# Patient Record
Sex: Female | Born: 1980
Health system: Southern US, Community
[De-identification: ages and names within clinical notes are randomized; demographics above are authoritative.]

## PROBLEM LIST (undated history)

## (undated) ENCOUNTER — Inpatient Hospital Stay (HOSPITAL_COMMUNITY): Payer: Self-pay

## (undated) DIAGNOSIS — E0590 Thyrotoxicosis, unspecified without thyrotoxic crisis or storm: Secondary | ICD-10-CM

## (undated) DIAGNOSIS — R1110 Vomiting, unspecified: Secondary | ICD-10-CM

## (undated) DIAGNOSIS — B789 Strongyloidiasis, unspecified: Secondary | ICD-10-CM

## (undated) DIAGNOSIS — D219 Benign neoplasm of connective and other soft tissue, unspecified: Secondary | ICD-10-CM

## (undated) HISTORY — DX: Strongyloidiasis, unspecified: B78.9

## (undated) HISTORY — PX: OTHER SURGICAL HISTORY: SHX169

## (undated) HISTORY — PX: NO PAST SURGERIES: SHX2092

---

## 2008-11-29 ENCOUNTER — Other Ambulatory Visit: Admission: RE | Admit: 2008-11-29 | Discharge: 2008-11-29 | Payer: Self-pay | Admitting: Family Medicine

## 2008-12-18 ENCOUNTER — Emergency Department (HOSPITAL_COMMUNITY): Admission: EM | Admit: 2008-12-18 | Discharge: 2008-12-18 | Payer: Self-pay | Admitting: Family Medicine

## 2008-12-31 ENCOUNTER — Ambulatory Visit (HOSPITAL_COMMUNITY): Admission: RE | Admit: 2008-12-31 | Discharge: 2008-12-31 | Payer: Self-pay | Admitting: Gastroenterology

## 2009-01-15 ENCOUNTER — Ambulatory Visit (HOSPITAL_COMMUNITY): Admission: RE | Admit: 2009-01-15 | Discharge: 2009-01-15 | Payer: Self-pay | Admitting: Gastroenterology

## 2009-01-23 ENCOUNTER — Other Ambulatory Visit: Admission: RE | Admit: 2009-01-23 | Discharge: 2009-01-23 | Payer: Self-pay | Admitting: Obstetrics and Gynecology

## 2009-02-19 ENCOUNTER — Encounter (INDEPENDENT_AMBULATORY_CARE_PROVIDER_SITE_OTHER): Payer: Self-pay | Admitting: Obstetrics and Gynecology

## 2009-02-19 ENCOUNTER — Ambulatory Visit (HOSPITAL_COMMUNITY): Admission: RE | Admit: 2009-02-19 | Discharge: 2009-02-19 | Payer: Self-pay | Admitting: Obstetrics and Gynecology

## 2010-04-08 ENCOUNTER — Inpatient Hospital Stay (HOSPITAL_COMMUNITY): Admission: AD | Admit: 2010-04-08 | Discharge: 2010-04-10 | Payer: Self-pay | Admitting: Obstetrics and Gynecology

## 2010-08-12 LAB — CBC
HCT: 37.9 % (ref 36.0–46.0)
Hemoglobin: 12.6 g/dL (ref 12.0–15.0)
MCH: 33.4 pg (ref 26.0–34.0)
MCHC: 33.3 g/dL (ref 30.0–36.0)
MCV: 101.1 fL — ABNORMAL HIGH (ref 78.0–100.0)
Platelets: 167 10*3/uL (ref 150–400)
RDW: 13.8 % (ref 11.5–15.5)
WBC: 16.5 10*3/uL — ABNORMAL HIGH (ref 4.0–10.5)

## 2010-08-12 LAB — RH IMMUNE GLOB WKUP(>/=20WKS)(NOT WOMEN'S HOSP): Unit division: 0

## 2010-09-05 LAB — CBC
Hemoglobin: 13.4 g/dL (ref 12.0–15.0)
RDW: 19.8 % — ABNORMAL HIGH (ref 11.5–15.5)

## 2010-09-05 LAB — URINALYSIS, ROUTINE W REFLEX MICROSCOPIC
Bilirubin Urine: NEGATIVE
Glucose, UA: NEGATIVE mg/dL
Hgb urine dipstick: NEGATIVE
Ketones, ur: NEGATIVE mg/dL
Nitrite: NEGATIVE
pH: 8 (ref 5.0–8.0)

## 2010-09-05 LAB — RH IMMUNE GLOBULIN WORKUP (NOT WOMEN'S HOSP)
ABO/RH(D): O NEG
Antibody Screen: NEGATIVE

## 2010-09-23 ENCOUNTER — Other Ambulatory Visit (HOSPITAL_COMMUNITY)
Admission: RE | Admit: 2010-09-23 | Discharge: 2010-09-23 | Disposition: A | Discharge status: Home / Self Care | Payer: Self-pay | Source: Ambulatory Visit | Attending: Obstetrics and Gynecology | Admitting: Obstetrics and Gynecology

## 2010-09-23 DIAGNOSIS — Z01419 Encounter for gynecological examination (general) (routine) without abnormal findings: Secondary | ICD-10-CM | POA: Insufficient documentation

## 2011-06-02 NOTE — L&D Delivery Note (Signed)
Delivery Note At 5:21 PM a viable female was delivered via Vaginal, Spontaneous Delivery (Presentation: ; Occiput Anterior).  APGAR:9 ,9 ; weight .   Placenta status intact : , .  Cord: 3 vessels with the following complications: None.  Cord pH: NA  Anesthesia: Epidural  Episiotomy: Right Mediolateral Lacerations: None Suture Repair: 3.0 vicryl Est. Blood Loss (mL): 400  Mom to postpartum.  Baby to nursery-stable.  Benigno Check J. 04/11/2012, 5:50 PM

## 2011-09-04 ENCOUNTER — Encounter (HOSPITAL_COMMUNITY): Payer: Self-pay | Admitting: *Deleted

## 2011-09-04 ENCOUNTER — Inpatient Hospital Stay (HOSPITAL_COMMUNITY)
Admission: AD | Admit: 2011-09-04 | Discharge: 2011-09-04 | Disposition: A | Discharge status: Home / Self Care | Payer: 59 | Source: Ambulatory Visit | Attending: Obstetrics and Gynecology | Admitting: Obstetrics and Gynecology

## 2011-09-04 DIAGNOSIS — O210 Mild hyperemesis gravidarum: Secondary | ICD-10-CM

## 2011-09-04 LAB — URINALYSIS, ROUTINE W REFLEX MICROSCOPIC
Glucose, UA: NEGATIVE mg/dL
Nitrite: NEGATIVE
Specific Gravity, Urine: 1.01 (ref 1.005–1.030)
pH: 8.5 — ABNORMAL HIGH (ref 5.0–8.0)

## 2011-09-04 LAB — URINE MICROSCOPIC-ADD ON

## 2011-09-04 MED ORDER — ONDANSETRON 8 MG PO TBDP: 8.0000 mg | ORAL_TABLET | Freq: Three times a day (TID) | ORAL | Status: AC | PRN

## 2011-09-04 MED ORDER — DEXTROSE 5 % IV BOLUS: 1000.0000 mL | Freq: Once | INTRAVENOUS | Status: DC

## 2011-09-04 MED ORDER — ONDANSETRON 8 MG/NS 50 ML IVPB
8.0000 mg | Freq: Once | INTRAVENOUS | Status: AC
  Administered 2011-09-04: 8 mg via INTRAVENOUS
  Filled 2011-09-04: qty 8

## 2011-09-04 MED ORDER — DEXTROSE IN LACTATED RINGERS 5 % IV SOLN
Freq: Once | INTRAVENOUS | Status: AC
  Administered 2011-09-04: 14:00:00 via INTRAVENOUS

## 2011-09-04 NOTE — Discharge Instructions (Signed)
Morning Sickness Morning sickness is when you feel sick to your stomach (nauseous) during pregnancy. You may feel sick to your stomach and throw up (vomit). You may feel sick in the morning, but you can feel this way any time of day. Some women feel very sick to their stomach and cannot stop throwing up (hyperemesis gravidarum). HOME CARE  Take multivitamins as told by your doctor. Taking multivitamins before getting pregnant can stop or lessen the harshness of morning sickness.   Eat dry toast or unsalted crackers before getting out of bed.   Eat 5 to 6 small meals a day.   Eat dry and bland foods like rice and baked potatoes.   Do not drink liquids with meals. Drink between meals.   Do not eat greasy, fatty, or spicy foods.   Have someone cook for you if the smell of food causes you to feel sick or throw up.   Do not take vitamins with iron, or as told by your doctor.   Eat protein when you need a snack (nuts, yogurt, cheese).   Eat unsweetened gelatins for dessert.   Wear a bracelet used for sea sickness (acupressure wristband).   Go to a doctor that puts thin needles into certain body points (acupuncture) to improve how you feel.   Do not smoke.   Use a humidifier to keep the air in your house free of odors.  GET HELP RIGHT AWAY IF:   You feel very sick to your stomach and cannot stop throwing up.   You pass out (faint).   You have a fever.   You need medicine to feel better.   You feel dizzy or lightheaded.   You are losing weight.   You need help knowing what to eat and what not to eat.  MAKE SURE YOU:   Understand these instructions.   Will watch your condition.   Will get help right away if you are not doing well or get worse.  Document Released: 06/25/2004 Document Revised: 05/07/2011 Document Reviewed: 08/15/2009 [employer] Patient Information 2012 [employer].Morning Sickness Morning sickness is when you feel sick to your stomach (nauseous) during  pregnancy. You may feel sick to your stomach and throw up (vomit). You may feel sick in the morning, but you can feel this way any time of day. Some women feel very sick to their stomach and cannot stop throwing up (hyperemesis gravidarum). HOME CARE  Take multivitamins as told by your doctor. Taking multivitamins before getting pregnant can stop or lessen the harshness of morning sickness.   Eat dry toast or unsalted crackers before getting out of bed.   Eat 5 to 6 small meals a day.   Eat dry and bland foods like rice and baked potatoes.   Do not drink liquids with meals. Drink between meals.   Do not eat greasy, fatty, or spicy foods.   Have someone cook for you if the smell of food causes you to feel sick or throw up.   Do not take vitamins with iron, or as told by your doctor.   Eat protein when you need a snack (nuts, yogurt, cheese).   Eat unsweetened gelatins for dessert.   Wear a bracelet used for sea sickness (acupressure wristband).   Go to a doctor that puts thin needles into certain body points (acupuncture) to improve how you feel.   Do not smoke.   Use a humidifier to keep the air in your house free of odors.  GET   HELP RIGHT AWAY IF:   You feel very sick to your stomach and cannot stop throwing up.   You pass out (faint).   You have a fever.   You need medicine to feel better.   You feel dizzy or lightheaded.   You are losing weight.   You need help knowing what to eat and what not to eat.  MAKE SURE YOU:   Understand these instructions.   Will watch your condition.   Will get help right away if you are not doing well or get worse.  Document Released: 06/25/2004 Document Revised: 05/07/2011 Document Reviewed: 08/15/2009 [employer] Patient Information 2012 [employer]. 

## 2011-09-04 NOTE — MAU Provider Note (Signed)
  History     CSN: [id number]  Arrival date and time: 09/04/11 1153   None     No chief complaint on file.  HPI Pt is [redacted] weeks pregnant and has been vomiting 6 x daily.  She has been nauseated for 3 weeks.  She denies spotting/bleeding/abdominal pain, constipation or diarrhea. Pt went to Dr. Dawayne Patricia office this morning and they sent her here for hydration and antiemetics. No past medical history on file.  No past surgical history on file.  No family history on file.  History  Substance Use Topics  . Smoking status: Not on file  . Smokeless tobacco: Not on file  . Alcohol Use: Not on file    Allergies: Allergies not on file  No prescriptions prior to admission    Review of Systems  Constitutional: Negative for fever.  Gastrointestinal: Positive for nausea and vomiting. Negative for abdominal pain, diarrhea and constipation.  Genitourinary: Negative for dysuria and urgency.   Physical Exam   There were no vitals taken for this visit.  Physical Exam  Vitals reviewed. Constitutional: She is oriented to person, place, and time. She appears well-developed and well-nourished.  HENT:  Head: Normocephalic.  Eyes: Pupils are equal, round, and reactive to light.  Neck: Normal range of motion. Neck supple.  Cardiovascular: Normal rate.   Respiratory: Effort normal.  GI: Soft.  Musculoskeletal: Normal range of motion.  Neurological: She is alert and oriented to person, place, and time.  Skin: Skin is warm and dry.  Psychiatric: She has a normal mood and affect.    MAU Course  Procedures Results for orders placed during the hospital encounter of 09/04/11 (from the past 24 hour(s))  URINALYSIS, ROUTINE W REFLEX MICROSCOPIC     Status: Abnormal   Collection Time   09/04/11 12:44 PM      Component Value Range   Color, Urine YELLOW  YELLOW    APPearance CLEAR  CLEAR    Specific Gravity, Urine 1.010  1.005 - 1.030    pH 8.5 (*) 5.0 - 8.0    Glucose, UA NEGATIVE  NEGATIVE  (mg/dL)   Hgb urine dipstick NEGATIVE  NEGATIVE    Bilirubin Urine NEGATIVE  NEGATIVE    Ketones, ur NEGATIVE  NEGATIVE (mg/dL)   Protein, ur NEGATIVE  NEGATIVE (mg/dL)   Urobilinogen, UA 0.2  0.0 - 1.0 (mg/dL)   Nitrite NEGATIVE  NEGATIVE    Leukocytes, UA MODERATE (*) NEGATIVE   URINE MICROSCOPIC-ADD ON     Status: Normal   Collection Time   09/04/11 12:44 PM      Component Value Range   Squamous Epithelial / LPF RARE  RARE    WBC, UA 0-2  <3 (WBC/hpf)   RBC / HPF 0-2  <3 (RBC/hpf)   Bacteria, UA RARE  RARE   pt hydrated with D5LR and antiemetic of Zofran IVP And tolerated PO crackers and water Urine sent for C&S since pt had mod leukocytes    Assessment and Plan  Hyperemesis in pregnancy Rx for Zofran and f/u for prenatal care  Daruis Swaim 09/04/2011, 12:37 PM

## 2011-09-04 NOTE — MAU Note (Signed)
Pt reports having N/V x3 weeks. Not able to keep anything down. Having ptyalism as well.

## 2011-09-10 ENCOUNTER — Other Ambulatory Visit (HOSPITAL_COMMUNITY)
Admission: RE | Admit: 2011-09-10 | Discharge: 2011-09-10 | Disposition: A | Discharge status: Home / Self Care | Payer: 59 | Source: Ambulatory Visit | Attending: Obstetrics and Gynecology | Admitting: Obstetrics and Gynecology

## 2011-09-10 ENCOUNTER — Other Ambulatory Visit: Payer: Self-pay | Admitting: Obstetrics and Gynecology

## 2011-09-10 ENCOUNTER — Other Ambulatory Visit (HOSPITAL_COMMUNITY): Admission: RE | Admit: 2011-09-10 | Payer: 59 | Source: Ambulatory Visit | Admitting: Obstetrics and Gynecology

## 2011-09-10 DIAGNOSIS — Z01419 Encounter for gynecological examination (general) (routine) without abnormal findings: Secondary | ICD-10-CM | POA: Insufficient documentation

## 2011-09-10 DIAGNOSIS — Z113 Encounter for screening for infections with a predominantly sexual mode of transmission: Secondary | ICD-10-CM | POA: Insufficient documentation

## 2011-09-13 NOTE — MAU Provider Note (Signed)
Pt in office for routine ob intake interview with nurse, when nausea/vomiting discussed.  Pt's husband reported 5+ episodes of emesis daily.   He reported pt complains of dizziness and fatigue.  Pt also with excessive saliva. Pt had not used any medications for n/v. Pt sent to MAU for IVF and IV Zofran. Gen:  NAD CV:  RRR, no murmers Lungs:  CTA bilaterally, no crackles Abd: soft, nontender Ext: No edema, no tenderness  UA 3+ Ket at office  A/p Hyperemesis, first trimester IVF and IV Zofran. D/c home with Zofran. Precautions previously given at office.

## 2011-09-22 ENCOUNTER — Inpatient Hospital Stay (HOSPITAL_COMMUNITY)
Admission: AD | Admit: 2011-09-22 | Discharge: 2011-09-23 | Disposition: A | Discharge status: Home / Self Care | Payer: 59 | Source: Ambulatory Visit | Attending: Obstetrics and Gynecology | Admitting: Obstetrics and Gynecology

## 2011-09-22 DIAGNOSIS — O210 Mild hyperemesis gravidarum: Secondary | ICD-10-CM | POA: Insufficient documentation

## 2011-09-22 DIAGNOSIS — O219 Vomiting of pregnancy, unspecified: Secondary | ICD-10-CM

## 2011-09-22 HISTORY — DX: Vomiting, unspecified: R11.10

## 2011-09-23 ENCOUNTER — Encounter (HOSPITAL_COMMUNITY): Payer: Self-pay | Admitting: *Deleted

## 2011-09-23 LAB — COMPREHENSIVE METABOLIC PANEL
ALT: 9 U/L (ref 0–35)
CO2: 27 mEq/L (ref 19–32)
Calcium: 9.9 mg/dL (ref 8.4–10.5)
Creatinine, Ser: 0.49 mg/dL — ABNORMAL LOW (ref 0.50–1.10)
GFR calc Af Amer: >90 mL/min (ref >90)
GFR calc non Af Amer: >90 mL/min (ref >90)
Glucose, Bld: 92 mg/dL (ref 70–99)
Sodium: 132 mEq/L — ABNORMAL LOW (ref 135–145)
Total Protein: 6.7 g/dL (ref 6.0–8.3)

## 2011-09-23 LAB — CBC
MCH: 31.5 pg (ref 26.0–34.0)
MCHC: 34.8 g/dL (ref 30.0–36.0)
MCV: 90.6 fL (ref 78.0–100.0)
Platelets: 371 10*3/uL (ref 150–400)

## 2011-09-23 LAB — URINE MICROSCOPIC-ADD ON

## 2011-09-23 LAB — URINALYSIS, ROUTINE W REFLEX MICROSCOPIC
Bilirubin Urine: NEGATIVE
Hgb urine dipstick: NEGATIVE
Ketones, ur: NEGATIVE mg/dL
Nitrite: NEGATIVE
Urobilinogen, UA: 0.2 mg/dL (ref 0.0–1.0)

## 2011-09-23 MED ORDER — PROMETHAZINE HCL 25 MG/ML IJ SOLN
12.5000 mg | Freq: Once | INTRAMUSCULAR | Status: AC
  Administered 2011-09-23: 12.5 mg via INTRAVENOUS
  Filled 2011-09-23: qty 1

## 2011-09-23 MED ORDER — DEXTROSE 5 % IN LACTATED RINGERS IV BOLUS: 1000.0000 mL | Freq: Once | INTRAVENOUS | Status: DC

## 2011-09-23 MED ORDER — ONDANSETRON 8 MG/NS 50 ML IVPB
8.0000 mg | Freq: Once | INTRAVENOUS | Status: AC
  Administered 2011-09-23: 8 mg via INTRAVENOUS
  Filled 2011-09-23: qty 8

## 2011-09-23 MED ORDER — DOCUSATE SODIUM 100 MG PO CAPS: 100.0000 mg | ORAL_CAPSULE | Freq: Two times a day (BID) | ORAL | Status: AC

## 2011-09-23 MED ORDER — PROMETHAZINE HCL 25 MG RE SUPP: 25.0000 mg | Freq: Four times a day (QID) | RECTAL | Status: DC | PRN

## 2011-09-23 NOTE — MAU Note (Signed)
N. Frazier, CNM at bedside.  Assessment done and poc discussed with pt.  

## 2011-09-23 NOTE — MAU Note (Signed)
Pt states that she has been vomiting with no relief-zofran,phenergan does not help-also throazine did not help

## 2011-09-23 NOTE — MAU Provider Note (Signed)
History     CSN: [id number]  Arrival date and time: 09/22/11 2333   None     Chief Complaint  Patient presents with  . Morning Sickness   HPI 31 y.o. G3P1011 at [redacted]w[redacted]d with n/v ongoing throughout pregnancy, worse in last 24 hours, no relief with phenergan or thorazine. Was taking Zofran earlier in pregnancy with no relief. Pt states 10-15 lb weight loss during this pregnancy. Only eating white rice and plain yogurt. Unable to provide urine sample upon arrival.  Friend present as interpreter.   Past Medical History  Diagnosis Date  . Hyperemesis     Past Surgical History  Procedure Date  . No past surgeries     History reviewed. No pertinent family history.  History  Substance Use Topics  . Smoking status: Never Smoker   . Smokeless tobacco: Not on file  . Alcohol Use: No    Allergies: No Known Allergies  Prescriptions prior to admission  Medication Sig Dispense Refill  . chlorproMAZINE (THORAZINE) 25 MG tablet Take 25 mg by mouth 2 (two) times daily.      . ondansetron (ZOFRAN-ODT) 4 MG disintegrating tablet Take 4 mg by mouth every 8 (eight) hours as needed. Used for nausea.      . Prenatal Vit-Fe Fumarate-FA (PRENATAL MULTIVITAMIN) TABS Take 1 tablet by mouth daily.      . promethazine (PHENERGAN) 25 MG tablet Take 25 mg by mouth every 6 (six) hours as needed. Used for nausea.        Review of Systems  Constitutional: Negative.   Respiratory: Negative.   Cardiovascular: Negative.   Gastrointestinal: Positive for nausea and vomiting. Negative for abdominal pain, diarrhea and constipation.  Genitourinary: Negative for dysuria, urgency, frequency, hematuria and flank pain.       Negative for vaginal bleeding  Musculoskeletal: Negative.   Neurological: Negative.   Psychiatric/Behavioral: Negative.    Physical Exam   Blood pressure 97/65, pulse 97, temperature 98.9 F (37.2 C), temperature source Oral, resp. rate 20, height 5\' 4"  (1.626 m), weight 160 lb  (72.576 kg), SpO2 100.00%.  Physical Exam  Nursing note and vitals reviewed. Constitutional: She is oriented to person, place, and time. She appears well-developed and well-nourished. She appears distressed (uncomfortable/nauseous appearing).  Cardiovascular: Normal rate.   Respiratory: Effort normal.  GI: Soft. Bowel sounds are normal. She exhibits no distension and no mass. There is no tenderness. There is no rebound and no guarding.  Musculoskeletal: Normal range of motion.  Neurological: She is alert and oriented to person, place, and time.  Skin: Skin is warm and dry.    MAU Course  Procedures  Results for orders placed during the hospital encounter of 09/22/11 (from the past 24 hour(s))  CBC     Status: Normal   Collection Time   09/23/11 12:45 AM      Component Value Range   WBC 8.0  4.0 - 10.5 (K/uL)   RBC 4.03  3.87 - 5.11 (MIL/uL)   Hemoglobin 12.7  12.0 - 15.0 (g/dL)   HCT 30.8  65.7 - 84.6 (%)   MCV 90.6  78.0 - 100.0 (fL)   MCH 31.5  26.0 - 34.0 (pg)   MCHC 34.8  30.0 - 36.0 (g/dL)   RDW 96.2  95.2 - 84.1 (%)   Platelets 371  150 - 400 (K/uL)  COMPREHENSIVE METABOLIC PANEL     Status: Abnormal   Collection Time   09/23/11 12:45 AM      Component  Value Range   Sodium 132 (*) 135 - 145 (mEq/L)   Potassium 3.4 (*) 3.5 - 5.1 (mEq/L)   Chloride 95 (*) 96 - 112 (mEq/L)   CO2 27  19 - 32 (mEq/L)   Glucose, Bld 92  70 - 99 (mg/dL)   BUN 3 (*) 6 - 23 (mg/dL)   Creatinine, Ser 1.61 (*) 0.50 - 1.10 (mg/dL)   Calcium 9.9  8.4 - 09.6 (mg/dL)   Total Protein 6.7  6.0 - 8.3 (g/dL)   Albumin 3.2 (*) 3.5 - 5.2 (g/dL)   AST 16  0 - 37 (U/L)   ALT 9  0 - 35 (U/L)   Alkaline Phosphatase 44  39 - 117 (U/L)   Total Bilirubin 0.3  0.3 - 1.2 (mg/dL)   GFR calc non Af Amer >90  >90 (mL/min)   GFR calc Af Amer >90  >90 (mL/min)  URINALYSIS, ROUTINE W REFLEX MICROSCOPIC     Status: Abnormal   Collection Time   09/23/11  1:30 AM      Component Value Range   Color, Urine YELLOW   YELLOW    APPearance CLEAR  CLEAR    Specific Gravity, Urine <1.005 (*) 1.005 - 1.030    pH 5.5  5.0 - 8.0    Glucose, UA NEGATIVE  NEGATIVE (mg/dL)   Hgb urine dipstick NEGATIVE  NEGATIVE    Bilirubin Urine NEGATIVE  NEGATIVE    Ketones, ur NEGATIVE  NEGATIVE (mg/dL)   Protein, ur NEGATIVE  NEGATIVE (mg/dL)   Urobilinogen, UA 0.2  0.0 - 1.0 (mg/dL)   Nitrite NEGATIVE  NEGATIVE    Leukocytes, UA SMALL (*) NEGATIVE   URINE MICROSCOPIC-ADD ON     Status: Abnormal   Collection Time   09/23/11  1:30 AM      Component Value Range   Squamous Epithelial / LPF FEW (*) RARE    WBC, UA 7-10  <3 (WBC/hpf)   RBC / HPF 0-2  <3 (RBC/hpf)   Bacteria, UA MANY (*) RARE    Tolerating PO fluids following Zofran 8 mg and Phenergan 12.5 mg IV.    Assessment and Plan  31 y.o. G3P1011 at [redacted]w[redacted]d Pregnancy nausea and vomiting - continue prescribed meds Rx Phenergan suppositories and colace F/U as scheduled or PRN  Margaret Richards 09/23/2011, 2:35 AM

## 2012-04-11 ENCOUNTER — Inpatient Hospital Stay (HOSPITAL_COMMUNITY): Payer: 59 | Admitting: Anesthesiology

## 2012-04-11 ENCOUNTER — Encounter (HOSPITAL_COMMUNITY): Payer: Self-pay | Admitting: Anesthesiology

## 2012-04-11 ENCOUNTER — Inpatient Hospital Stay (HOSPITAL_COMMUNITY)
Admission: RE | Admit: 2012-04-11 | Discharge: 2012-04-13 | DRG: 775 | Disposition: A | Discharge status: Home / Self Care | Payer: 59 | Source: Ambulatory Visit | Attending: Obstetrics and Gynecology | Admitting: Obstetrics and Gynecology

## 2012-04-11 ENCOUNTER — Encounter (HOSPITAL_COMMUNITY): Payer: Self-pay

## 2012-04-11 DIAGNOSIS — Z8759 Personal history of other complications of pregnancy, childbirth and the puerperium: Secondary | ICD-10-CM

## 2012-04-11 DIAGNOSIS — O480 Post-term pregnancy: Principal | ICD-10-CM | POA: Diagnosis present

## 2012-04-11 LAB — OB RESULTS CONSOLE RPR
RPR: NONREACTIVE
RPR: NONREACTIVE

## 2012-04-11 LAB — CBC
HCT: 29.4 % — ABNORMAL LOW (ref 36.0–46.0)
MCH: 27 pg (ref 26.0–34.0)
MCV: 85.2 fL (ref 78.0–100.0)
Platelets: 226 10*3/uL (ref 150–400)
RBC: 3.45 MIL/uL — ABNORMAL LOW (ref 3.87–5.11)
WBC: 8.7 10*3/uL (ref 4.0–10.5)

## 2012-04-11 LAB — RPR: RPR Ser Ql: NONREACTIVE

## 2012-04-11 LAB — OB RESULTS CONSOLE GBS: GBS: NEGATIVE

## 2012-04-11 LAB — PREPARE RBC (CROSSMATCH)

## 2012-04-11 MED ORDER — PRENATAL MULTIVITAMIN CH
1.0000 | ORAL_TABLET | Freq: Every day | ORAL | Status: DC
  Administered 2012-04-11 – 2012-04-13 (×3): 1 via ORAL
  Filled 2012-04-11 (×3): qty 1

## 2012-04-11 MED ORDER — OXYTOCIN 40 UNITS IN LACTATED RINGERS INFUSION - SIMPLE MED: 62.5000 mL/h | INTRAVENOUS | Status: DC

## 2012-04-11 MED ORDER — TETANUS-DIPHTH-ACELL PERTUSSIS 5-2.5-18.5 LF-MCG/0.5 IM SUSP
0.5000 mL | Freq: Once | INTRAMUSCULAR | Status: AC
  Administered 2012-04-12: 0.5 mL via INTRAMUSCULAR
  Filled 2012-04-11 (×2): qty 0.5

## 2012-04-11 MED ORDER — MISOPROSTOL 200 MCG PO TABS
ORAL_TABLET | ORAL | Status: AC
  Filled 2012-04-11: qty 5

## 2012-04-11 MED ORDER — DIPHENHYDRAMINE HCL 50 MG/ML IJ SOLN: 12.5000 mg | INTRAMUSCULAR | Status: DC | PRN

## 2012-04-11 MED ORDER — SENNOSIDES-DOCUSATE SODIUM 8.6-50 MG PO TABS
2.0000 | ORAL_TABLET | Freq: Every day | ORAL | Status: DC
  Administered 2012-04-11 – 2012-04-12 (×2): 2 via ORAL

## 2012-04-11 MED ORDER — SIMETHICONE 80 MG PO CHEW: 80.0000 mg | CHEWABLE_TABLET | ORAL | Status: DC | PRN

## 2012-04-11 MED ORDER — DIPHENHYDRAMINE HCL 25 MG PO CAPS: 25.0000 mg | ORAL_CAPSULE | Freq: Four times a day (QID) | ORAL | Status: DC | PRN

## 2012-04-11 MED ORDER — FENTANYL 2.5 MCG/ML BUPIVACAINE 1/10 % EPIDURAL INFUSION (WH - ANES)
14.0000 mL/h | INTRAMUSCULAR | Status: DC
  Administered 2012-04-11: 14 mL/h via EPIDURAL
  Filled 2012-04-11: qty 125

## 2012-04-11 MED ORDER — EPHEDRINE 5 MG/ML INJ: 10.0000 mg | INTRAVENOUS | Status: DC | PRN

## 2012-04-11 MED ORDER — LIDOCAINE HCL (PF) 1 % IJ SOLN
30.0000 mL | INTRAMUSCULAR | Status: DC | PRN
  Filled 2012-04-11: qty 30

## 2012-04-11 MED ORDER — LIDOCAINE HCL (PF) 1 % IJ SOLN
INTRAMUSCULAR | Status: DC | PRN
  Administered 2012-04-11 (×2): 5 mL

## 2012-04-11 MED ORDER — ONDANSETRON HCL 4 MG/2ML IJ SOLN: 4.0000 mg | INTRAMUSCULAR | Status: DC | PRN

## 2012-04-11 MED ORDER — OXYTOCIN BOLUS FROM INFUSION: 500.0000 mL | INTRAVENOUS | Status: DC

## 2012-04-11 MED ORDER — CITRIC ACID-SODIUM CITRATE 334-500 MG/5ML PO SOLN: 30.0000 mL | ORAL | Status: DC | PRN

## 2012-04-11 MED ORDER — BUTORPHANOL TARTRATE 1 MG/ML IJ SOLN: 1.0000 mg | INTRAMUSCULAR | Status: DC | PRN

## 2012-04-11 MED ORDER — LACTATED RINGERS IV SOLN
INTRAVENOUS | Status: DC
  Administered 2012-04-11: 900 mL via INTRAVENOUS

## 2012-04-11 MED ORDER — BENZOCAINE-MENTHOL 20-0.5 % EX AERO
1.0000 "application " | INHALATION_SPRAY | CUTANEOUS | Status: DC | PRN
  Administered 2012-04-11: 1 via TOPICAL
  Filled 2012-04-11: qty 56

## 2012-04-11 MED ORDER — FLEET ENEMA 7-19 GM/118ML RE ENEM: 1.0000 | ENEMA | RECTAL | Status: DC | PRN

## 2012-04-11 MED ORDER — WITCH HAZEL-GLYCERIN EX PADS
1.0000 "application " | MEDICATED_PAD | CUTANEOUS | Status: DC | PRN
  Administered 2012-04-13: 1 via TOPICAL

## 2012-04-11 MED ORDER — ACETAMINOPHEN 325 MG PO TABS: 650.0000 mg | ORAL_TABLET | ORAL | Status: DC | PRN

## 2012-04-11 MED ORDER — OXYCODONE-ACETAMINOPHEN 5-325 MG PO TABS: 1.0000 | ORAL_TABLET | ORAL | Status: DC | PRN

## 2012-04-11 MED ORDER — PHENYLEPHRINE 40 MCG/ML (10ML) SYRINGE FOR IV PUSH (FOR BLOOD PRESSURE SUPPORT)
80.0000 ug | PREFILLED_SYRINGE | INTRAVENOUS | Status: DC | PRN
  Filled 2012-04-11: qty 5

## 2012-04-11 MED ORDER — TERBUTALINE SULFATE 1 MG/ML IJ SOLN: 0.2500 mg | Freq: Once | INTRAMUSCULAR | Status: DC | PRN

## 2012-04-11 MED ORDER — PHENYLEPHRINE 40 MCG/ML (10ML) SYRINGE FOR IV PUSH (FOR BLOOD PRESSURE SUPPORT): 80.0000 ug | PREFILLED_SYRINGE | INTRAVENOUS | Status: DC | PRN

## 2012-04-11 MED ORDER — LACTATED RINGERS IV SOLN
500.0000 mL | INTRAVENOUS | Status: DC | PRN
  Administered 2012-04-11: 500 mL via INTRAVENOUS

## 2012-04-11 MED ORDER — METHYLERGONOVINE MALEATE 0.2 MG/ML IJ SOLN: 0.2000 mg | INTRAMUSCULAR | Status: DC | PRN

## 2012-04-11 MED ORDER — INFLUENZA VIRUS VACC SPLIT PF IM SUSP
0.5000 mL | INTRAMUSCULAR | Status: AC
  Administered 2012-04-12: 0.5 mL via INTRAMUSCULAR

## 2012-04-11 MED ORDER — ZOLPIDEM TARTRATE 5 MG PO TABS: 5.0000 mg | ORAL_TABLET | Freq: Every evening | ORAL | Status: DC | PRN

## 2012-04-11 MED ORDER — DIBUCAINE 1 % RE OINT: 1.0000 "application " | TOPICAL_OINTMENT | RECTAL | Status: DC | PRN

## 2012-04-11 MED ORDER — LACTATED RINGERS IV SOLN: 500.0000 mL | Freq: Once | INTRAVENOUS | Status: DC

## 2012-04-11 MED ORDER — IBUPROFEN 600 MG PO TABS
600.0000 mg | ORAL_TABLET | Freq: Four times a day (QID) | ORAL | Status: DC
  Administered 2012-04-12 – 2012-04-13 (×6): 600 mg via ORAL
  Filled 2012-04-11 (×6): qty 1

## 2012-04-11 MED ORDER — OXYTOCIN 40 UNITS IN LACTATED RINGERS INFUSION - SIMPLE MED
1.0000 m[IU]/min | INTRAVENOUS | Status: DC
  Administered 2012-04-11: 2 m[IU]/min via INTRAVENOUS
  Administered 2012-04-11: 4 m[IU]/min via INTRAVENOUS
  Filled 2012-04-11: qty 1000

## 2012-04-11 MED ORDER — EPHEDRINE 5 MG/ML INJ
10.0000 mg | INTRAVENOUS | Status: DC | PRN
  Filled 2012-04-11: qty 4

## 2012-04-11 MED ORDER — METHYLERGONOVINE MALEATE 0.2 MG PO TABS: 0.2000 mg | ORAL_TABLET | ORAL | Status: DC | PRN

## 2012-04-11 MED ORDER — ONDANSETRON HCL 4 MG PO TABS: 4.0000 mg | ORAL_TABLET | ORAL | Status: DC | PRN

## 2012-04-11 MED ORDER — IBUPROFEN 600 MG PO TABS
600.0000 mg | ORAL_TABLET | Freq: Four times a day (QID) | ORAL | Status: DC | PRN
  Administered 2012-04-11: 600 mg via ORAL
  Filled 2012-04-11: qty 1

## 2012-04-11 MED ORDER — FERROUS SULFATE 325 (65 FE) MG PO TABS
325.0000 mg | ORAL_TABLET | Freq: Two times a day (BID) | ORAL | Status: DC
  Administered 2012-04-12 – 2012-04-13 (×3): 325 mg via ORAL
  Filled 2012-04-11 (×3): qty 1

## 2012-04-11 MED ORDER — LANOLIN HYDROUS EX OINT: TOPICAL_OINTMENT | CUTANEOUS | Status: DC | PRN

## 2012-04-11 MED ORDER — OXYCODONE-ACETAMINOPHEN 5-325 MG PO TABS
1.0000 | ORAL_TABLET | ORAL | Status: DC | PRN
  Administered 2012-04-12 (×2): 1 via ORAL
  Filled 2012-04-11 (×2): qty 1

## 2012-04-11 MED ORDER — ONDANSETRON HCL 4 MG/2ML IJ SOLN: 4.0000 mg | Freq: Four times a day (QID) | INTRAMUSCULAR | Status: DC | PRN

## 2012-04-11 NOTE — Anesthesia Preprocedure Evaluation (Signed)
Anesthesia Evaluation  Patient identified by MRN, date of birth, ID band Patient awake    Reviewed: Allergy & Precautions, H&P , Patient's Chart, lab work & pertinent test results  Airway Mallampati: II TM Distance: >3 FB Neck ROM: full    Dental No notable dental hx.    Pulmonary neg pulmonary ROS,  breath sounds clear to auscultation  Pulmonary exam normal       Cardiovascular negative cardio ROS  Rhythm:regular Rate:Normal     Neuro/Psych negative neurological ROS  negative psych ROS   GI/Hepatic negative GI ROS, Neg liver ROS,   Endo/Other  negative endocrine ROS  Renal/GU negative Renal ROS     Musculoskeletal   Abdominal   Peds  Hematology negative hematology ROS (+)   Anesthesia Other Findings Hyperemesis  Reproductive/Obstetrics (+) Pregnancy                           Anesthesia Physical Anesthesia Plan  ASA: II  Anesthesia Plan: Epidural   Post-op Pain Management:    Induction:   Airway Management Planned:   Additional Equipment:   Intra-op Plan:   Post-operative Plan:   Informed Consent: I have reviewed the patients History and Physical, chart, labs and discussed the procedure including the risks, benefits and alternatives for the proposed anesthesia with the patient or authorized representative who has indicated his/her understanding and acceptance.     Plan Discussed with:   Anesthesia Plan Comments:         Anesthesia Quick Evaluation

## 2012-04-11 NOTE — Progress Notes (Signed)
Margaret Richards is a 31 y.o. G3P1011 at [redacted]w[redacted]d by LMP admitted for induction of labor due to Post dates. Due date 11//08/2011.  Subjective:  She is comfortable with her epidural +FM    Objective: BP 103/62  Pulse 83  Temp 97.1 F (36.2 C) (Oral)  Resp 18  Ht 5\' 3"  (1.6 m)  Wt 79.833 kg (176 lb)  BMI 31.18 kg/m2  SpO2 96%      FHT:  FHR: 125 bpm, variability: moderate,  accelerations:  Present,  decelerations:  Absent UC:   regular, every 4  minutes SVE:   Dilation: 5 Effacement (%): 50 Station: -1 Exam by:: dr Richardson Dopp  Labs: Lab Results  Component Value Date   WBC 8.7 04/11/2012   HGB 9.3* 04/11/2012   HCT 29.4* 04/11/2012   MCV 85.2 04/11/2012   PLT 226 04/11/2012    Assessment / Plan: Induction of labor due to post dates ,  progressing well on pitocin  Labor: Progressing normally Preeclampsia:  NA Fetal Wellbeing:  Category I Pain Control:  Epidural I/D:  n/a Anticipated MOD:  NSVD  Margaret Richards J. 04/11/2012, 3:04 PM

## 2012-04-11 NOTE — Anesthesia Procedure Notes (Signed)

## 2012-04-11 NOTE — H&P (Signed)
Margaret Richards is a 31 y.o. female presenting for induction secondary to post dates. She is [redacted] wks ega.+ FM no lof irregular contraction. No vaginal bleeding     History OB History    Grav Para Term Preterm Abortions TAB SAB Ect Mult Living   3 1 1  1  1   1      Past Medical History  Diagnosis Date  . Hyperemesis    Past Surgical History  Procedure Date  . No past surgeries    Family History: family history is not on file. Social History:  reports that she has never smoked. She does not have any smokeless tobacco history on file. She reports that she does not drink alcohol or use illicit drugs.   Prenatal Transfer Tool  Maternal Diabetes: No Genetic Screening: Normal Maternal Ultrasounds/Referrals: Normal Fetal Ultrasounds or other Referrals:  None Maternal Substance Abuse:  No Significant Maternal Medications:  None Significant Maternal Lab Results:  Lab values include: Group B Strep negative Other Comments:  None  Review of Systems  All other systems reviewed and are negative.    Dilation: 2.5 Effacement (%): 70 Station: -2 Exam by:: Dherr rn Blood pressure 110/69, pulse 84, temperature 98.4 F (36.9 C), temperature source Oral, resp. rate 18, height 5\' 3"  (1.6 m), weight 79.833 kg (176 lb). Maternal Exam:  Uterine Assessment: Contraction strength is mild.  Contraction duration is 60 seconds. Contraction frequency is irregular.   Abdomen: Patient reports no abdominal tenderness. Fundal height is 40.   Estimated fetal weight is 8 lbs .   Fetal presentation: vertex  Introitus: Normal vulva. Normal vagina.  Ferning test: not done.  Nitrazine test: not done. Amniotic fluid character: clear.  Pelvis: adequate for delivery.   Cervix: Cervix evaluated by digital exam.     Fetal Exam Fetal Monitor Review: Mode: fetoscope.   Baseline rate: 125.  Variability: moderate (6-25 bpm).   Pattern: accelerations present and no decelerations.    Fetal State  Assessment: Category I - tracings are normal.     Physical Exam  Constitutional: She is oriented to person, place, and time. She appears well-developed and well-nourished.  HENT:  Head: Normocephalic.  Neck: Normal range of motion.  Cardiovascular: Normal rate and regular rhythm.   Respiratory: Breath sounds normal.  Genitourinary: Vagina normal.  Musculoskeletal: Normal range of motion. She exhibits no edema.  Neurological: She is alert and oriented to person, place, and time.  Skin: Skin is warm and dry.  Psychiatric: She has a normal mood and affect.    Prenatal labs: ABO, Rh:  O negative   Antibody:  Negative  Rubella:  Immune   RPR:   None reactive  HBsAg:   Negative  HIV:   Nonreactive  GBS:   Negative   Assessment/Plan: 41 wks post dates for induction. Pt was informed of increased r/o cesarean section associated with induction and accepts this risk..  Pitocin. Anticipate svd    Londa Mackowski J. 04/11/2012, 10:18 AM

## 2012-04-12 DIAGNOSIS — Z8759 Personal history of other complications of pregnancy, childbirth and the puerperium: Secondary | ICD-10-CM

## 2012-04-12 LAB — CBC
HCT: 26.5 % — ABNORMAL LOW (ref 36.0–46.0)
Hemoglobin: 8.4 g/dL — ABNORMAL LOW (ref 12.0–15.0)
WBC: 12.6 10*3/uL — ABNORMAL HIGH (ref 4.0–10.5)

## 2012-04-12 MED ORDER — RHO D IMMUNE GLOBULIN 1500 UNIT/2ML IJ SOLN
300.0000 ug | Freq: Once | INTRAMUSCULAR | Status: AC
  Administered 2012-04-12: 300 ug via INTRAMUSCULAR
  Filled 2012-04-12: qty 2

## 2012-04-12 NOTE — Anesthesia Postprocedure Evaluation (Signed)
  Anesthesia Post-op Note  Patient: Margaret Richards  Procedure(s) Performed: * No procedures listed *  Patient Location: Mother/Baby  Anesthesia Type:Epidural  Level of Consciousness: awake, alert  and oriented  Airway and Oxygen Therapy: Patient Spontanous Breathing  Post-op Pain: none  Post-op Assessment: Post-op Vital signs reviewed, Patient's Cardiovascular Status Stable, No headache, No backache, No residual numbness and No residual motor weakness  Post-op Vital Signs: Reviewed and stable  Complications: No apparent anesthesia complications

## 2012-04-12 NOTE — Progress Notes (Signed)
Post Partum Day 1 s/p svd   Subjective: no complaints, up ad lib, voiding and tolerating PO  Objective: Blood pressure 108/68, pulse 64, temperature 98.1 F (36.7 C), temperature source Oral, resp. rate 20, height 5\' 3"  (1.6 m), weight 79.833 kg (176 lb), SpO2 99.00%, unknown if currently breastfeeding.  Physical Exam:  General: alert and cooperative Lochia: appropriate Uterine Fundus: firm Incision: NA DVT Evaluation: No evidence of DVT seen on physical exam.   Basename 04/12/12 0515 04/11/12 0730  HGB 8.4* 9.3*  HCT 26.5* 29.4*    Assessment/Plan: Plan for discharge tomorrow and Circumcision prior to discharge   LOS: 1 day   Margaret Richards J. 04/12/2012, 5:11 PM

## 2012-04-13 LAB — RH IG WORKUP (INCLUDES ABO/RH)
ABO/RH(D): O NEG
Fetal Screen: NEGATIVE
Unit division: 0

## 2012-04-13 LAB — TYPE AND SCREEN
Antibody Screen: NEGATIVE
Unit division: 0

## 2012-04-13 MED ORDER — FERROUS SULFATE 325 (65 FE) MG PO TABS: 325.0000 mg | ORAL_TABLET | Freq: Every day | ORAL | Status: DC

## 2012-04-13 MED ORDER — OXYCODONE-ACETAMINOPHEN 5-325 MG PO TABS: 1.0000 | ORAL_TABLET | Freq: Four times a day (QID) | ORAL | Status: DC | PRN

## 2012-04-13 MED ORDER — IBUPROFEN 600 MG PO TABS: 600.0000 mg | ORAL_TABLET | Freq: Four times a day (QID) | ORAL | Status: DC | PRN

## 2012-04-13 NOTE — Progress Notes (Signed)
Post Partum Day 1 Subjective: no complaints, up ad lib, voiding and tolerating PO  Objective: Blood pressure 100/65, pulse 80, temperature 98.2 F (36.8 C), temperature source Oral, resp. rate 18, height 5\' 3"  (1.6 m), weight 79.833 kg (176 lb), SpO2 99.00%, unknown if currently breastfeeding.  Physical Exam:  General: alert and cooperative Lochia: appropriate Uterine Fundus: firm Incision: NA DVT Evaluation: No evidence of DVT seen on physical exam.   Basename 04/12/12 0515 04/11/12 0730  HGB 8.4* 9.3*  HCT 26.5* 29.4*    Assessment/Plan: Discharge home and Breastfeeding   LOS: 2 days   Gokul Waybright J. 04/13/2012, 8:04 AM

## 2012-04-13 NOTE — Discharge Summary (Signed)
Obstetric Discharge Summary Reason for Admission: induction of labor Prenatal Procedures: none Intrapartum Procedures: spontaneous vaginal delivery and episiotomy Right mediolateral  Postpartum Procedures: none Complications-Operative and Postpartum: none Hemoglobin  Date Value Range Status  04/12/2012 8.4* 12.0 - 15.0 g/dL Final     HCT  Date Value Range Status  04/12/2012 26.5* 36.0 - 46.0 % Final    Physical Exam:  General: alert and cooperative Lochia: appropriate Uterine Fundus: firm Incision: NA DVT Evaluation: No evidence of DVT seen on physical exam.  Discharge Diagnoses: Term Pregnancy-delivered  Discharge Information: Date: 04/13/2012 Activity: pelvic rest Diet: routine Medications: PNV, Ibuprofen, Iron and Percocet Condition: stable Instructions: refer to practice specific booklet Discharge to: home Follow-up Information    Follow up with Jessee Avers., MD. Schedule an appointment as soon as possible for a visit in 6 weeks.   Contact information:   [address] [place] [address] [phone]          Newborn Data: Live born female  Birth Weight: 8 lb 2 oz (3685 g) APGAR: 9, 9  Home with mother.  Elva Mauro J. 04/13/2012, 8:17 AM

## 2012-04-13 NOTE — Discharge Instructions (Signed)
Newborn Baby Care °BATHING YOUR BABY °· Babies only need a bath 2 to 3 times a week. If you clean up spills and spit up and keep the diaper clean, your baby will not need a bath more often. Do not give your baby a tub bath until the umbilical cord is off and the belly button has normal looking skin. Use a sponge bath only. °· Pick a time of the day when you can relax and enjoy this special time with your baby. Avoid bathing just before or after feedings. °· Wash your hands with warm water and soap. Get all of the needed equipment ready for the baby. °· Equipment includes: °· Basin of warm water (always check to be sure it is not too hot). °· Mild soap and baby shampoo. °· Soft washcloth and towel (may use cloth diaper). °· Cotton balls. °· Clean clothes and blankets. °· Diapers. °· Never leave your baby alone on a high suface where the baby can roll off. °· Always keep 1 hand on your baby when giving a bath. Never leave your baby alone in a bath. °· To keep your baby warm, cover your baby with a cloth except where you are sponge bathing. °· Start the bath by cleansing each eye with a separate corner of the cloth or separate cotton balls. Stroke from the inner corner of the eye to the outer corner, using clear water only. Do not use soap on your baby's face. Then, wash the rest of your baby's face. °· It is not necessary to clean the ears or nose with cotton-tipped swabs. Just wash the outside folds of the ears and nose. If mucus collects in the nose that you can see, it may be removed by twisting a wet cotton ball and wiping the mucus away. Cotton-tipped swabs may injure the tender inside of the nose. °· To wash the head, support the baby's neck and head with your hand. Wet the hair, then shampoo with a small amount of baby shampoo. Rinse thoroughly with warm water from a washcloth. If there is cradle cap, gently loosen the scales with a soft brush before rinsing. °· Continue to wash the rest of the body. Gently  clean in and around all the creases and folds. Remove the soap completely. This will help prevent dry skin. °· For girls, clean between the folds of the labia using a cotton ball soaked with water. Stroke downward. Some babies have a bloody discharge from the vagina (birth canal). This is due to the sudden change of hormones following birth. There may be a white discharge also. Both are normal. For boys, follow circumcision care instructions. °UMBILICAL CORD CARE °The umbilical cord should fall off and heal by 2 to 3 weeks of life. Your newborn should receive only sponge baths until the umbilical cord has fallen off and healed. The umbilical cord and area around the stump do not need specific care, but should be kept clean and dry. If the umbilical stump becomes dirty, it can be cleaned with plain water and dried by placing cloth around the stump. Folding down the front part of the diaper can help dry out the base of the cord. This may make it fall off faster. You may notice a foul odor before it falls off. When the cord comes off and the skin has sealed over the navel, the baby can be placed in a bathtub. Call your caregiver if your baby has:  °· Redness around the umbilical area. °· Swelling   around the umbilical area. °· Discharge from the umbilical stump. °· Pain when you touch the belly. °CIRCUMCISION CARE °· If your baby boy was circumcised: °· There may be a strip of petroleum jelly gauze wrapped around the penis. If so, remove this after 24 hours or sooner if soiled with stool. °· Wash the penis gently with warm water and a soft cloth or cotton ball and dry it. You may apply petroleum jelly to his penis with each diaper change, until the area is well healed. Healing usually takes 2 to 3 days. °· If a plastic ring circumcision was done, gently wash and dry the penis. Apply petroleum jelly several times a day or as directed by your baby's caregiver until healed. The plastic ring at the end of the penis will  loosen around the edges and drop off within 5 to 8 days after the circumcision was done. Do not pull the ring off. °· If the plastic ring has not dropped off after 8 days or if the penis becomes very swollen and has drainage or bright red bleeding, call your caregiver. °· If your baby was not circumcised, do not pull back the foreskin. This will cause pain, as it is not ready to be pulled back. The inside of the foreskin does not need cleaning. Just clean the outer skin. °COLOR °· A small amount of bluishness of the hands and feet is normal for a newborn. Bluish or grayish color of the baby's face or body is not normal. Call for medical help. °· Newborns can have many normal birthmarks on their bodies. Ask your baby's nurse or caregiver about any you find. °· When crying, the newborn's skin color often becomes deep red. This is normal. °· Jaundice is a yellowish color of the skin or in the white part of the baby's eyes. If your baby is becoming jaundiced, call your baby's caregiver. °BOWEL MOVEMENTS °The baby's first bowel movements are sticky, greenish black stools called meconium. The first bowel movement normally occurs within the first 36 hours of life. The stool changes to a mustard-yellow loose stool if the baby is breastfed or a thicker yellow-tan stool if the baby is fed formula. Your baby may make stool after each feeding or 4 to 5 times per day in the first weeks after birth. Each baby is different. After the first month, stools of breastfed babies become less frequent, even fewer than 1 a day. Formula-fed babies tend to have at least 1 stool per day.  °Diarrhea is defined as many watery stools in a day. If the baby has diarrhea you may see a water ring surrounding the stool on the diaper. Constipation is defined as hard stools that seem to be painful for the baby to pass. However, most newborns grunt and strain when passing any stool. This is normal. °GENERAL CARE TIPS  °· Babies should be placed to sleep  on their backs unless your caregiver has suggested otherwise. This is the single most important thing you can do to reduce the risk of sudden infant death syndrome. °· Do not use a pillow when putting the baby to sleep. °· Fingers and toenails should be cut while the baby is sleeping, if possible, and only after you can see a distinct separation between the nail and the skin under it. °· It is not necessary to take the baby's temperature daily. Take it only when you think the skin seems warmer than usual or if the baby seems sick. (Take it   before calling your caregiver.) Lubricate the thermometer with petroleum jelly and insert the bulb end approximately  inch into the rectum. Stay with the baby and hold the thermometer in place 2 to 3 minutes by squeezing the cheeks together.  The disposable bulb syringe used on your baby will be sent home with you. Use it to remove mucus from the nose if your baby gets congested. Squeeze the bulb end together, insert the tip very gently into one nostril, and let the bulb expand. It will suck mucus out of the nostril. Empty the bulb by squeezing out the mucus into a sink. Repeat on the second side. Wash the bulb syringe well with soap and water, and rinse thoroughly after each use.  Do not over dress the baby. Dress him or her according to the weather. One extra layer more than what you are wearing is a good guideline. If the skin feels warm and damp from perspiring, your baby is too warm and will be restless.  It is not recommended that you take your infant out in crowded public areas (such as shopping malls) until the baby is several weeks old. In crowds of people, the baby will be exposed to colds, virus, and diseases. Avoid children and adults who are obviously sick. It is good to take the infant out into the fresh air.  It is not recommended that you take your baby on long-distance trips before your baby is 3 to 36 months old, unless it is necessary.  Microwaves  should not be used for heating formula. The bottle remains cool, but the formula may become very hot. Reheating breast milk in a microwave reduces or eliminates natural immunity properties of the milk. Many infants will tolerate frozen breast milk that has been thawed to room temperature without additional warming. If necessary, it is more desirable to warm the thawed milk in a bottle placed in a pan of warm water. Be sure to check the temperature of the milk before feeding.  Wash your hands with hot water and soap after changing the baby's diaper and using the restroom.  Keep all your baby's doctor appointments and scheduled immunizations. SEEK MEDICAL CARE IF:  The cord stump does not fall off by the time the baby is 59 weeks old. SEEK IMMEDIATE MEDICAL CARE IF:   Your baby is 4 months old or younger with a rectal temperature of 100.4 F (38 C) or higher.  Your baby is older than 3 months with a rectal temperature of 102 F (38.9 C) or higher.  The baby seems to have little energy or is less active and alert when awake than usual.  The baby is not eating.  The baby is crying more than usual or the cry has a different tone or sound to it.  The baby has vomited more than once (most babies will spit up with burping, which is normal).  The baby appears to be ill.  The baby has diaper rash that does not clear up in 3 days after treatment, has sores, pus, or bleeding.  There is active bleeding at the umbilical cord site. A small amount of spotting is normal.  There has been no bowel movement in 4 days.  There is persistent diarrhea or blood in the stool.  The baby has bluish or gray looking skin.  There is yellow color to the baby's eyes or skin. Document Released: 05/15/2000 Document Revised: 08/10/2011 Document Reviewed: 12/05/2007 [facility] Patient Information 2013 [place], [place]. Newborn Baby Care  BATHING YOUR BABY  Babies only need a bath 2 to 3 times a week. If you clean up  spills and spit up and keep the diaper clean, your baby will not need a bath more often. Do not give your baby a tub bath until the umbilical cord is off and the belly button has normal looking skin. Use a sponge bath only.  Pick a time of the day when you can relax and enjoy this special time with your baby. Avoid bathing just before or after feedings.  Wash your hands with warm water and soap. Get all of the needed equipment ready for the baby.  Equipment includes:  Basin of warm water (always check to be sure it is not too hot).  Mild soap and baby shampoo.  Soft washcloth and towel (may use cloth diaper).  Cotton balls.  Clean clothes and blankets.  Diapers.  Never leave your baby alone on a high suface where the baby can roll off.  Always keep 1 hand on your baby when giving a bath. Never leave your baby alone in a bath.  To keep your baby warm, cover your baby with a cloth except where you are sponge bathing.  Start the bath by cleansing each eye with a separate corner of the cloth or separate cotton balls. Stroke from the inner corner of the eye to the outer corner, using clear water only. Do not use soap on your baby's face. Then, wash the rest of your baby's face.  It is not necessary to clean the ears or nose with cotton-tipped swabs. Just wash the outside folds of the ears and nose. If mucus collects in the nose that you can see, it may be removed by twisting a wet cotton ball and wiping the mucus away. Cotton-tipped swabs may injure the tender inside of the nose.  To wash the head, support the baby's neck and head with your hand. Wet the hair, then shampoo with a small amount of baby shampoo. Rinse thoroughly with warm water from a washcloth. If there is cradle cap, gently loosen the scales with a soft brush before rinsing.  Continue to wash the rest of the body. Gently clean in and around all the creases and folds. Remove the soap completely. This will help prevent dry  skin.  For girls, clean between the folds of the labia using a cotton ball soaked with water. Stroke downward. Some babies have a bloody discharge from the vagina (birth canal). This is due to the sudden change of hormones following birth. There may be a white discharge also. Both are normal. For boys, follow circumcision care instructions. UMBILICAL CORD CARE The umbilical cord should fall off and heal by 2 to 3 weeks of life. Your newborn should receive only sponge baths until the umbilical cord has fallen off and healed. The umbilical cord and area around the stump do not need specific care, but should be kept clean and dry. If the umbilical stump becomes dirty, it can be cleaned with plain water and dried by placing cloth around the stump. Folding down the front part of the diaper can help dry out the base of the cord. This may make it fall off faster. You may notice a foul odor before it falls off. When the cord comes off and the skin has sealed over the navel, the baby can be placed in a bathtub. Call your caregiver if your baby has:  Redness around the umbilical area.  Swelling around the  umbilical area.  Discharge from the umbilical stump.  Pain when you touch the belly. CIRCUMCISION CARE  If your baby boy was circumcised:  There may be a strip of petroleum jelly gauze wrapped around the penis. If so, remove this after 24 hours or sooner if soiled with stool.  Wash the penis gently with warm water and a soft cloth or cotton ball and dry it. You may apply petroleum jelly to his penis with each diaper change, until the area is well healed. Healing usually takes 2 to 3 days.  If a plastic ring circumcision was done, gently wash and dry the penis. Apply petroleum jelly several times a day or as directed by your baby's caregiver until healed. The plastic ring at the end of the penis will loosen around the edges and drop off within 5 to 8 days after the circumcision was done. Do not pull the  ring off.  If the plastic ring has not dropped off after 8 days or if the penis becomes very swollen and has drainage or bright red bleeding, call your caregiver.  If your baby was not circumcised, do not pull back the foreskin. This will cause pain, as it is not ready to be pulled back. The inside of the foreskin does not need cleaning. Just clean the outer skin. COLOR  A small amount of bluishness of the hands and feet is normal for a newborn. Bluish or grayish color of the baby's face or body is not normal. Call for medical help.  Newborns can have many normal birthmarks on their bodies. Ask your baby's nurse or caregiver about any you find.  When crying, the newborn's skin color often becomes deep red. This is normal.  Jaundice is a yellowish color of the skin or in the white part of the baby's eyes. If your baby is becoming jaundiced, call your baby's caregiver. BOWEL MOVEMENTS The baby's first bowel movements are sticky, greenish black stools called meconium. The first bowel movement normally occurs within the first 36 hours of life. The stool changes to a mustard-yellow loose stool if the baby is breastfed or a thicker yellow-tan stool if the baby is fed formula. Your baby may make stool after each feeding or 4 to 5 times per day in the first weeks after birth. Each baby is different. After the first month, stools of breastfed babies become less frequent, even fewer than 1 a day. Formula-fed babies tend to have at least 1 stool per day.  Diarrhea is defined as many watery stools in a day. If the baby has diarrhea you may see a water ring surrounding the stool on the diaper. Constipation is defined as hard stools that seem to be painful for the baby to pass. However, most newborns grunt and strain when passing any stool. This is normal. GENERAL CARE TIPS   Babies should be placed to sleep on their backs unless your caregiver has suggested otherwise. This is the single most important thing  you can do to reduce the risk of sudden infant death syndrome.  Do not use a pillow when putting the baby to sleep.  Fingers and toenails should be cut while the baby is sleeping, if possible, and only after you can see a distinct separation between the nail and the skin under it.  It is not necessary to take the baby's temperature daily. Take it only when you think the skin seems warmer than usual or if the baby seems sick. (Take it before calling  your caregiver.) Lubricate the thermometer with petroleum jelly and insert the bulb end approximately  inch into the rectum. Stay with the baby and hold the thermometer in place 2 to 3 minutes by squeezing the cheeks together.  The disposable bulb syringe used on your baby will be sent home with you. Use it to remove mucus from the nose if your baby gets congested. Squeeze the bulb end together, insert the tip very gently into one nostril, and let the bulb expand. It will suck mucus out of the nostril. Empty the bulb by squeezing out the mucus into a sink. Repeat on the second side. Wash the bulb syringe well with soap and water, and rinse thoroughly after each use.  Do not over dress the baby. Dress him or her according to the weather. One extra layer more than what you are wearing is a good guideline. If the skin feels warm and damp from perspiring, your baby is too warm and will be restless.  It is not recommended that you take your infant out in crowded public areas (such as shopping malls) until the baby is several weeks old. In crowds of people, the baby will be exposed to colds, virus, and diseases. Avoid children and adults who are obviously sick. It is good to take the infant out into the fresh air.  It is not recommended that you take your baby on long-distance trips before your baby is 3 to 57 months old, unless it is necessary.  Microwaves should not be used for heating formula. The bottle remains cool, but the formula may become very hot.  Reheating breast milk in a microwave reduces or eliminates natural immunity properties of the milk. Many infants will tolerate frozen breast milk that has been thawed to room temperature without additional warming. If necessary, it is more desirable to warm the thawed milk in a bottle placed in a pan of warm water. Be sure to check the temperature of the milk before feeding.  Wash your hands with hot water and soap after changing the baby's diaper and using the restroom.  Keep all your baby's doctor appointments and scheduled immunizations. SEEK MEDICAL CARE IF:  The cord stump does not fall off by the time the baby is 48 weeks old. SEEK IMMEDIATE MEDICAL CARE IF:   Your baby is 22 months old or younger with a rectal temperature of 100.4 F (38 C) or higher.  Your baby is older than 3 months with a rectal temperature of 102 F (38.9 C) or higher.  The baby seems to have little energy or is less active and alert when awake than usual.  The baby is not eating.  The baby is crying more than usual or the cry has a different tone or sound to it.  The baby has vomited more than once (most babies will spit up with burping, which is normal).  The baby appears to be ill.  The baby has diaper rash that does not clear up in 3 days after treatment, has sores, pus, or bleeding.  There is active bleeding at the umbilical cord site. A small amount of spotting is normal.  There has been no bowel movement in 4 days.  There is persistent diarrhea or blood in the stool.  The baby has bluish or gray looking skin.  There is yellow color to the baby's eyes or skin. Document Released: 05/15/2000 Document Revised: 08/10/2011 Document Reviewed: 12/05/2007 [facility] Patient Information 2013 [place], [place].

## 2012-10-26 ENCOUNTER — Other Ambulatory Visit (HOSPITAL_COMMUNITY)
Admission: RE | Admit: 2012-10-26 | Discharge: 2012-10-26 | Disposition: A | Discharge status: Home / Self Care | Payer: 59 | Source: Ambulatory Visit | Attending: Obstetrics and Gynecology | Admitting: Obstetrics and Gynecology

## 2012-10-26 ENCOUNTER — Other Ambulatory Visit: Payer: Self-pay | Admitting: Obstetrics and Gynecology

## 2012-10-26 DIAGNOSIS — Z1151 Encounter for screening for human papillomavirus (HPV): Secondary | ICD-10-CM | POA: Insufficient documentation

## 2012-10-26 DIAGNOSIS — Z01419 Encounter for gynecological examination (general) (routine) without abnormal findings: Secondary | ICD-10-CM | POA: Insufficient documentation

## 2013-04-04 ENCOUNTER — Ambulatory Visit
Admission: RE | Admit: 2013-04-04 | Discharge: 2013-04-04 | Disposition: A | Discharge status: Home / Self Care | Payer: No Typology Code available for payment source | Source: Ambulatory Visit | Attending: Infectious Disease | Admitting: Infectious Disease

## 2013-04-04 ENCOUNTER — Other Ambulatory Visit: Payer: Self-pay | Admitting: Infectious Disease

## 2013-04-04 DIAGNOSIS — R7611 Nonspecific reaction to tuberculin skin test without active tuberculosis: Secondary | ICD-10-CM

## 2013-12-30 ENCOUNTER — Inpatient Hospital Stay (HOSPITAL_COMMUNITY): Payer: 59

## 2013-12-30 ENCOUNTER — Encounter (HOSPITAL_COMMUNITY): Payer: Self-pay

## 2013-12-30 ENCOUNTER — Telehealth: Payer: Self-pay | Admitting: Obstetrics and Gynecology

## 2013-12-30 ENCOUNTER — Inpatient Hospital Stay (HOSPITAL_COMMUNITY)
Admission: AD | Admit: 2013-12-30 | Discharge: 2013-12-30 | Disposition: A | Discharge status: Home / Self Care | Payer: 59 | Source: Ambulatory Visit | Attending: Obstetrics and Gynecology | Admitting: Obstetrics and Gynecology

## 2013-12-30 DIAGNOSIS — R109 Unspecified abdominal pain: Secondary | ICD-10-CM | POA: Insufficient documentation

## 2013-12-30 DIAGNOSIS — O039 Complete or unspecified spontaneous abortion without complication: Secondary | ICD-10-CM | POA: Insufficient documentation

## 2013-12-30 DIAGNOSIS — E0590 Thyrotoxicosis, unspecified without thyrotoxic crisis or storm: Secondary | ICD-10-CM | POA: Insufficient documentation

## 2013-12-30 HISTORY — DX: Thyrotoxicosis, unspecified without thyrotoxic crisis or storm: E05.90

## 2013-12-30 LAB — URINALYSIS, ROUTINE W REFLEX MICROSCOPIC
Bilirubin Urine: NEGATIVE
Glucose, UA: NEGATIVE mg/dL
Ketones, ur: NEGATIVE mg/dL
NITRITE: NEGATIVE
PH: 6 (ref 5.0–8.0)
Protein, ur: 30 mg/dL — AB
UROBILINOGEN UA: 0.2 mg/dL (ref 0.0–1.0)

## 2013-12-30 LAB — URINE MICROSCOPIC-ADD ON

## 2013-12-30 LAB — CBC
HCT: 37 % (ref 36.0–46.0)
Hemoglobin: 12.9 g/dL (ref 12.0–15.0)
MCH: 32.5 pg (ref 26.0–34.0)
MCHC: 34.9 g/dL (ref 30.0–36.0)
MCV: 93.2 fL (ref 78.0–100.0)
PLATELETS: 320 10*3/uL (ref 150–400)
RBC: 3.97 MIL/uL (ref 3.87–5.11)
RDW: 12.8 % (ref 11.5–15.5)
WBC: 9.5 10*3/uL (ref 4.0–10.5)

## 2013-12-30 LAB — HCG, QUANTITATIVE, PREGNANCY: HCG, BETA CHAIN, QUANT, S: 4486 m[IU]/mL — AB (ref <5)

## 2013-12-30 LAB — POCT PREGNANCY, URINE: Preg Test, Ur: POSITIVE — AB

## 2013-12-30 MED ORDER — RHO D IMMUNE GLOBULIN 1500 UNIT/2ML IJ SOSY
300.0000 ug | PREFILLED_SYRINGE | Freq: Once | INTRAMUSCULAR | Status: AC
  Administered 2013-12-30: 300 ug via INTRAMUSCULAR
  Filled 2013-12-30: qty 2

## 2013-12-30 NOTE — MAU Note (Signed)
Pt presents complaining of heavy vaginal bleeding and abdominal pain that started yesterday. LMP 11/04/2013. Positive HPT x2

## 2013-12-30 NOTE — MAU Provider Note (Signed)
History     CSN: [id number]  Arrival date and time: 12/30/13 1840   None     Chief Complaint  Patient presents with  . Vaginal Bleeding   HPI Pt is G4P2012(one SAB) at [redacted]weeks pregnant presenting with heavy vaginal bleeding and abdominal cramping that started yesterday. Dr. Landry Mellow is patient's doctor and she has a New OB appointment on 01/12/2014. RN note: Glenford Peers, RN Registered Nurse Signed  MAU Note Service date: 12/30/2013 6:58 PM   Pt presents complaining of heavy vaginal bleeding and abdominal pain that started yesterday. LMP 11/04/2013. Positive HPT x2     Past Medical History  Diagnosis Date  . Hyperemesis   . Hyperthyroidism     Past Surgical History  Procedure Laterality Date  . No past surgeries      History reviewed. No pertinent family history.  History  Substance Use Topics  . Smoking status: Never Smoker   . Smokeless tobacco: Not on file  . Alcohol Use: No    Allergies: No Known Allergies  Prescriptions prior to admission  Medication Sig Dispense Refill  . Multiple Vitamins-Minerals (MULTIVITAMIN PO) Take 1 each by mouth daily.      Marland Kitchen PRESCRIPTION MEDICATION Take 1 tablet by mouth daily. Pt states that the [facility] here in [place] gave her a medication because she was possibly exposed to TB.  She is not sure of the name of the medication.        Review of Systems  Constitutional: Negative for fever and chills.  Gastrointestinal: Positive for abdominal pain. Negative for nausea, vomiting, diarrhea and constipation.  Genitourinary: Negative for dysuria and urgency.   Physical Exam   Blood pressure 120/71, pulse 91, temperature 98.3 F (36.8 C), temperature source Oral, resp. rate 18, last menstrual period 11/04/2013, unknown if currently breastfeeding.  Physical Exam  Nursing note and vitals reviewed. Constitutional: She is oriented to person, place, and time. She appears well-developed and well-nourished. No distress.  HENT:   Head: Normocephalic.  Eyes: Pupils are equal, round, and reactive to light.  Neck: Normal range of motion. Neck supple.  Cardiovascular: Normal rate.   Respiratory: Effort normal.  GI: Soft.  Genitourinary: Uterus is not enlarged (exam limited by maternal body habitus) and not tender. Cervix exhibits no motion tenderness, no discharge and no friability. Right adnexum displays no mass and no tenderness. Left adnexum displays no mass and no tenderness. There is bleeding (small amount of blood in the vaginal vault) around the vagina.  Musculoskeletal: Normal range of motion.  Neurological: She is alert and oriented to person, place, and time.  Skin: Skin is warm and dry.  Psychiatric: She has a normal mood and affect.    MAU Course  Procedures  Results for orders placed during the hospital encounter of 12/30/13 (from the past 24 hour(s))  URINALYSIS, ROUTINE W REFLEX MICROSCOPIC     Status: Abnormal   Collection Time    12/30/13  6:50 PM      Result Value Ref Range   Color, Urine YELLOW  YELLOW   APPearance CLOUDY (*) CLEAR   Specific Gravity, Urine <1.005 (*) 1.005 - 1.030   pH 6.0  5.0 - 8.0   Glucose, UA NEGATIVE  NEGATIVE mg/dL   Hgb urine dipstick LARGE (*) NEGATIVE   Bilirubin Urine NEGATIVE  NEGATIVE   Ketones, ur NEGATIVE  NEGATIVE mg/dL   Protein, ur 30 (*) NEGATIVE mg/dL   Urobilinogen, UA 0.2  0.0 - 1.0 mg/dL   Nitrite  NEGATIVE  NEGATIVE   Leukocytes, UA SMALL (*) NEGATIVE  URINE MICROSCOPIC-ADD ON     Status: Abnormal   Collection Time    12/30/13  6:50 PM      Result Value Ref Range   Squamous Epithelial / LPF FEW (*) RARE   WBC, UA 11-20  <3 WBC/hpf   RBC / HPF 11-20  <3 RBC/hpf   Bacteria, UA MANY (*) RARE  POCT PREGNANCY, URINE     Status: Abnormal   Collection Time    12/30/13  6:58 PM      Result Value Ref Range   Preg Test, Ur POSITIVE (*) NEGATIVE  CBC     Status: None   Collection Time    12/30/13  7:10 PM      Result Value Ref Range   WBC 9.5   4.0 - 10.5 K/uL   RBC 3.97  3.87 - 5.11 MIL/uL   Hemoglobin 12.9  12.0 - 15.0 g/dL   HCT 37.0  36.0 - 46.0 %   MCV 93.2  78.0 - 100.0 fL   MCH 32.5  26.0 - 34.0 pg   MCHC 34.9  30.0 - 36.0 g/dL   RDW 12.8  11.5 - 15.5 %   Platelets 320  150 - 400 K/uL  HCG, QUANTITATIVE, PREGNANCY     Status: Abnormal   Collection Time    12/30/13  7:10 PM      Result Value Ref Range   hCG, Beta Chain, Quant, S 4486 (*) <5 mIU/mL  RH IG WORKUP (INCLUDES ABO/RH)     Status: None   Collection Time    12/30/13  7:10 PM      Result Value Ref Range   Gestational Age(Wks) 8     ABO/RH(D) O NEG     Antibody Screen NEG     Unit Number [id number]     Blood Component Type RHIG     Unit division 00     Status of Unit ISSUED     Transfusion Status OK TO TRANSFUSE    care turned over to [facility] Sherolyn Buba 12/30/2013, 7:05 PM   2030 - Patient in [place]. Care assumed from Sherolyn Buba, NP  US Ob Comp Less 14 Wks  12/30/2013   CLINICAL DATA:  33 year old G4 P2 SAB 1, LMP 11/04/2013 (8 weeks 0 days), presenting with heavy vaginal bleeding and abdominal pain which began yesterday. Quantitative beta HCG 4,486.  EXAM: OBSTETRIC <14 WK [place] AND TRANSVAGINAL OB US  TECHNIQUE: Both transabdominal and transvaginal ultrasound examinations were performed for complete evaluation of the gestation as well as the maternal uterus, adnexal regions, and pelvic cul-de-sac. Transvaginal technique was performed to assess early pregnancy.  COMPARISON:  None.  FINDINGS: Intrauterine gestational sac: Not present.  Yolk sac:  Not present.  Embryo:  Not present.  Cardiac Activity: Not present.  MSD:  Not applicable.  CRL:   Not applicable.  [place] EDC: Not applicable.  Maternal uterus/adnexae: Markedly thickened, heterogeneous endometrium measuring up to approximately 28 mm in thickness. Submucosal fibroid involving the left posterior uterine body measuring approximately 3.7 x 3.0 x 3.8 cm. Subserosal fibroid involving the posterior  lower uterine body measuring approximately 1.3 x 1.0 x 1.3 cm.  Normal-appearing ovaries, the right measuring approximately 3.5 x 2.2 x 2.4 cm and the left measuring approximately 3.6 x 2.5 x 2.2 cm. No adnexal masses or free pelvic fluid.  IMPRESSION: 1. No evidence of intrauterine pregnancy. Heterogeneously thickened endometrium likely reflects hemorrhage/clot.  No adnexal mass or free pelvic fluid. Findings meet definitive criteria for failed pregnancy as long as the LMP is certain, as there is no embryo greater than 6 weeks after LMP. This follows SRU consensus guidelines: Diagnostic Criteria for Nonviable Pregnancy Early in the First Trimester. Alison Stalling J Med [phone]. 2. Uterine fibroids as detailed above. 3. Normal-appearing ovaries.   Electronically Signed   By: Evangeline Dakin M.D.   On: 12/30/2013 20:40   US Ob Transvaginal  12/30/2013   : CLINICAL DATA: 33 year old G4 P2 SAB 1, LMP 11/04/2013 (8 weeks 0 days), presenting with heavy vaginal bleeding and abdominal pain which began yesterday. Quantitative beta HCG 4,486.  EXAM: OBSTETRIC <14 WK [place] AND TRANSVAGINAL OB US  TECHNIQUE: Both transabdominal and transvaginal ultrasound examinations were performed for complete evaluation of the gestation as well as the maternal uterus, adnexal regions, and pelvic cul-de-sac. Transvaginal technique was performed to assess early pregnancy.  COMPARISON: None.  FINDINGS: Intrauterine gestational sac: Not present.  Yolk sac: Not present.  Embryo: Not present.  Cardiac Activity: Not present.  MSD: Not applicable.  CRL: Not applicable.  [place] EDC: Not applicable.  Maternal uterus/adnexae: Markedly thickened, heterogeneous endometrium measuring up to approximately 28 mm in thickness. Submucosal fibroid involving the left posterior uterine body measuring approximately 3.7 x 3.0 x 3.8 cm. Subserosal fibroid involving the posterior lower uterine body measuring approximately 1.3 x 1.0 x 1.3 cm.  Normal-appearing ovaries,  the right measuring approximately 3.5 x 2.2 x 2.4 cm and the left measuring approximately 3.6 x 2.5 x 2.2 cm. No adnexal masses or free pelvic fluid.  IMPRESSION: 1. No evidence of intrauterine pregnancy. Heterogeneously thickened endometrium likely reflects hemorrhage/clot. No adnexal mass or free pelvic fluid. Findings meet definitive criteria for failed pregnancy as long as the LMP is certain, as there is no embryo greater than 6 weeks after LMP. This follows SRU consensus guidelines: Diagnostic Criteria for Nonviable Pregnancy Early in the First Trimester. Alison Stalling J Med [phone]. 2. Uterine fibroids as detailed above. 3. Normal-appearing ovaries.   Electronically Signed   By: Evangeline Dakin M.D.   On: 12/30/2013 20:53    MDM Discussed results with Dr. Mancel Bale. Appears to be complete SAB. Patient is stable. Call the office on Monday for follow-up appointment next week with Dr. Landry Mellow.  Rhogam given in MAU today Assessment and Plan  A: Complete SAB  P: Discharge home Bleeding precautions discussed Patient advised to take Ibuprofen PRN for pain Patient advised to call the office on Monday for a follow-up appointment next week Patient may return to MAU as needed or if her condition were to change or worsen  Farris Has, PA-C 12/30/2013 9:38 PM

## 2013-12-30 NOTE — Discharge Instructions (Signed)
Miscarriage °A miscarriage is the loss of an unborn baby (fetus) before the 20th week of pregnancy. The cause is often unknown.  °HOME CARE °· You may need to stay in bed (bed rest), or you may be able to do light activity. Go about activity as told by your doctor. °· Have help at home. °· Write down how many pads you use each day. Write down how soaked they are. °· Do not use tampons. Do not wash out your vagina (douche) or have sex (intercourse) until your doctor approves. °· Only take medicine as told by your doctor. °· Do not take aspirin. °· Keep all doctor visits as told. °· If you or your partner have problems with grieving, talk to your doctor. You can also try counseling. Give yourself time to grieve before trying to get pregnant again. °GET HELP RIGHT AWAY IF: °· You have bad cramps or pain in your back or belly (abdomen). °· You have a fever. °· You pass large clumps of blood (clots) from your vagina that are walnut-sized or larger. Save the clumps for your doctor to see. °· You pass large amounts of tissue from your vagina. Save the tissue for your doctor to see. °· You have more bleeding. °· You have thick, bad-smelling fluid (discharge) coming from the vagina. °· You get lightheaded, weak, or you pass out (faint). °· You have chills. °MAKE SURE YOU: °· Understand these instructions. °· Will watch your condition. °· Will get help right away if you are not doing well or get worse. °Document Released: 08/10/2011 Document Reviewed: 08/10/2011 °ExitCare® Patient Information ©2015 [employer]. This information is not intended to replace advice given to you by your health care provider. Make sure you discuss any questions you have with your health care provider. ° °

## 2013-12-30 NOTE — Telephone Encounter (Signed)
TC from patient's husband--patient approx 5-[redacted] weeks pregnant with spotting and cramping.  Hx 2 previous FT deliveries and 1 SAB.  Patient and husband very concerned. Has appt with Dr. Landry Mellow 8/16. O negative type per hospital records. Will come to MAU for evaluation.  Donnel Saxon, CNM 5:10p

## 2013-12-31 LAB — RH IG WORKUP (INCLUDES ABO/RH)
ABO/RH(D): O NEG
ANTIBODY SCREEN: NEGATIVE
Gestational Age(Wks): 8
UNIT DIVISION: 0

## 2014-03-05 ENCOUNTER — Other Ambulatory Visit: Payer: Self-pay | Admitting: Obstetrics and Gynecology

## 2014-03-05 ENCOUNTER — Other Ambulatory Visit (HOSPITAL_COMMUNITY)
Admission: RE | Admit: 2014-03-05 | Discharge: 2014-03-05 | Disposition: A | Discharge status: Home / Self Care | Payer: 59 | Source: Ambulatory Visit | Attending: Obstetrics and Gynecology | Admitting: Obstetrics and Gynecology

## 2014-03-05 DIAGNOSIS — Z01419 Encounter for gynecological examination (general) (routine) without abnormal findings: Secondary | ICD-10-CM | POA: Insufficient documentation

## 2014-03-06 LAB — CYTOLOGY - PAP

## 2014-04-02 ENCOUNTER — Encounter (HOSPITAL_COMMUNITY): Payer: Self-pay

## 2014-07-04 ENCOUNTER — Encounter (HOSPITAL_COMMUNITY): Payer: Self-pay

## 2014-11-04 ENCOUNTER — Encounter (HOSPITAL_COMMUNITY): Payer: Self-pay | Admitting: *Deleted

## 2015-03-07 IMAGING — US US OB TRANSVAGINAL
1 series · 13 of 28 positions shown · non-contrast
Comparison: None.

:
CLINICAL DATA: 33-year-old G4 P2 SAB 1, LMP 11/04/2013 ([DATE] weeks 0
days), presenting with heavy vaginal bleeding and abdominal pain
which began yesterday. Quantitative beta HCG [DATE].

EXAM:
OBSTETRIC <14 WK US AND TRANSVAGINAL OB US
TECHNIQUE: Both transabdominal and transvaginal ultrasound examinations were
performed for complete evaluation of the gestation as well as the
maternal uterus, adnexal regions, and pelvic cul-de-sac.
Transvaginal technique was performed to assess early pregnancy.

[Series 1: us ob comp less 14 wks · 13 of 54 slices shown]
[im 2/54]
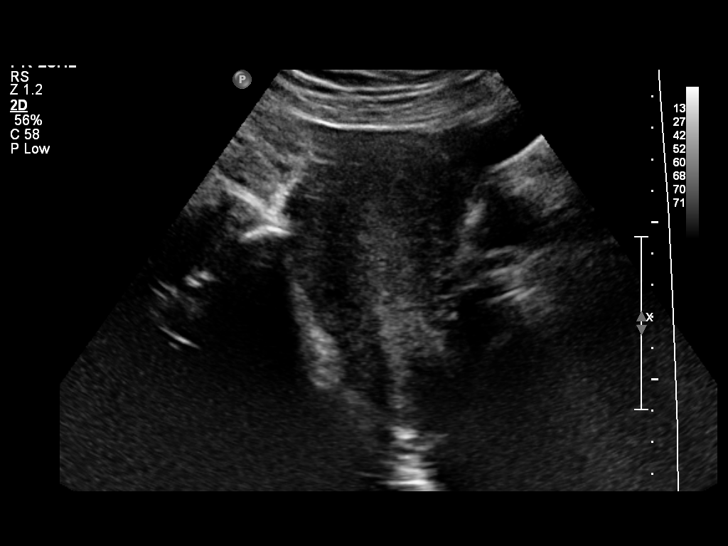
[im 6/54]
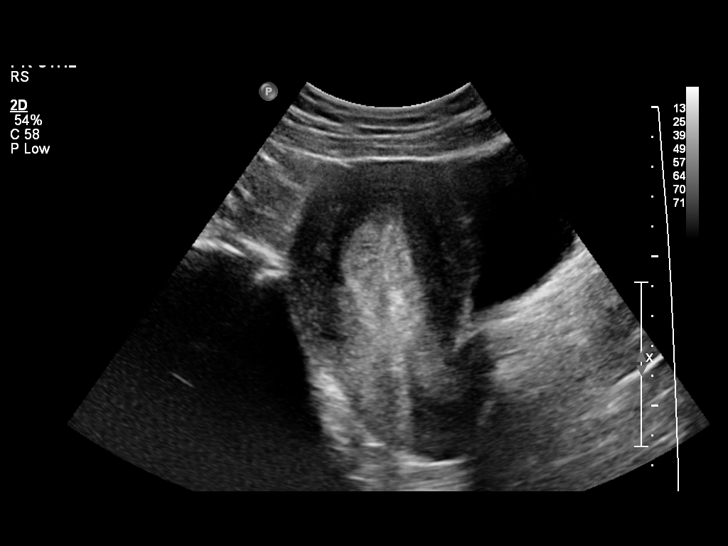
[im 10/54]
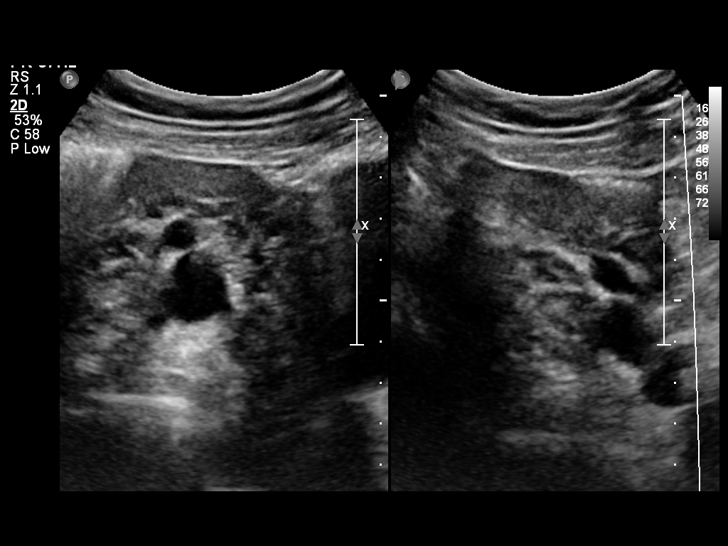
[im 14/54]
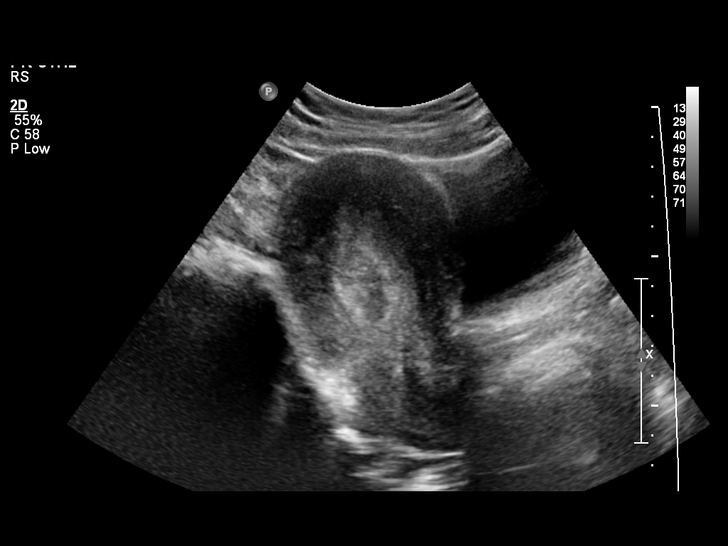
[im 18/54]
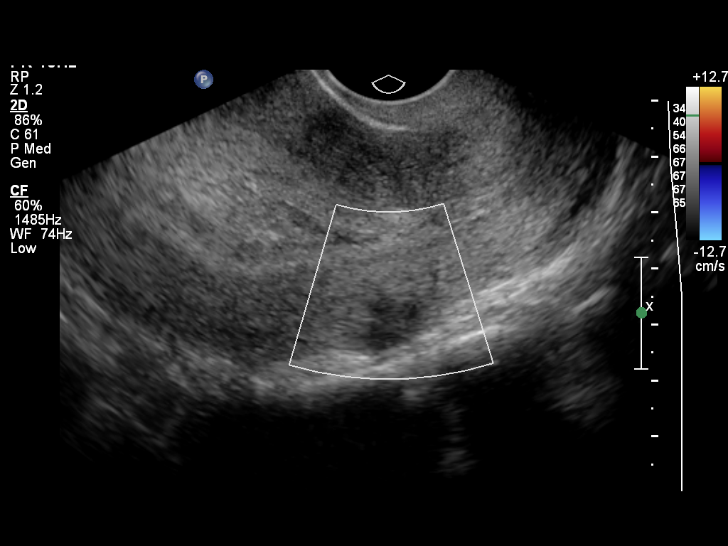
[im 22/54]
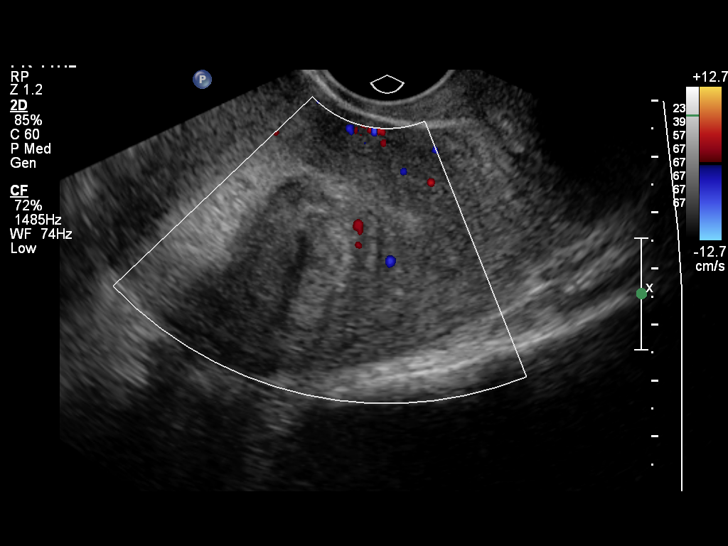
[im 28/54]
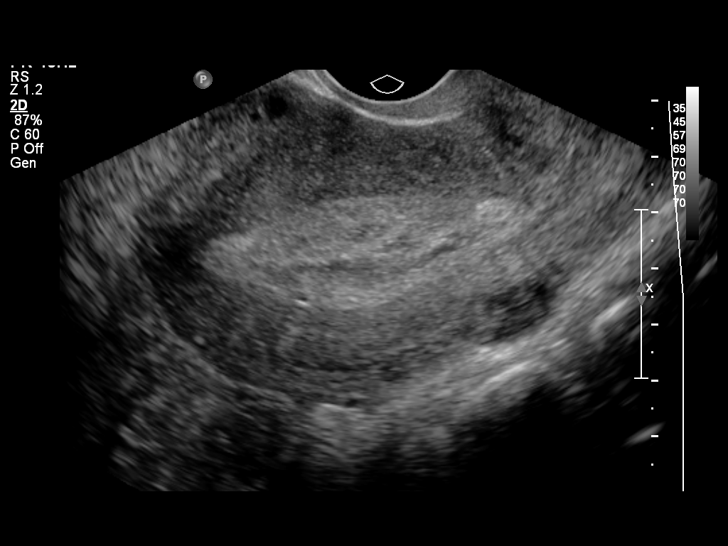
[im 32/54]
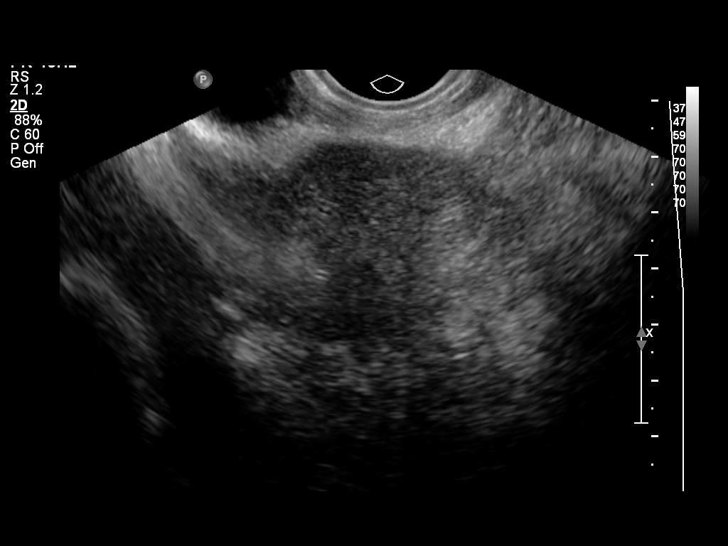
[im 36/54]
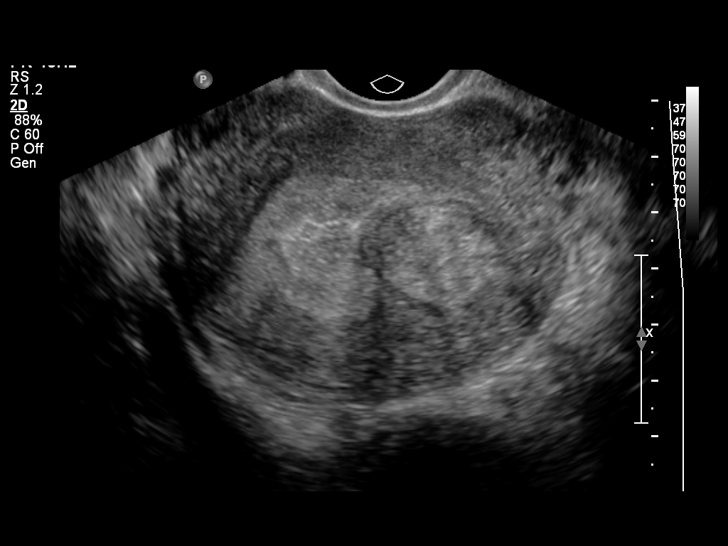
[im 40/54]
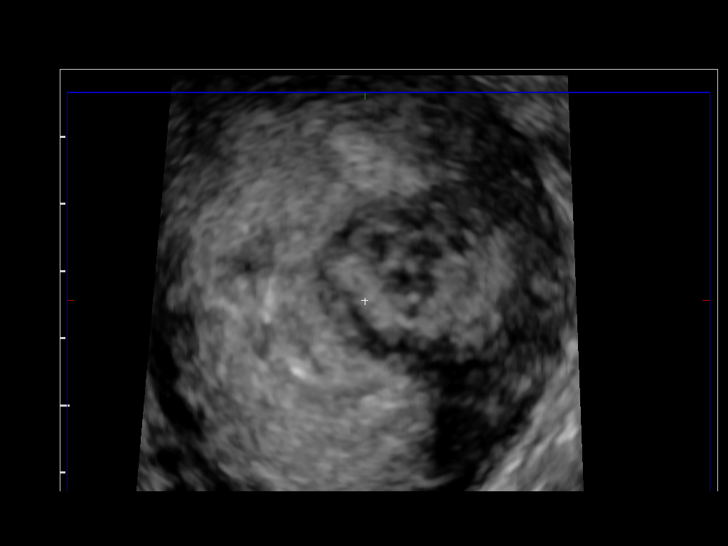
[im 44/54]
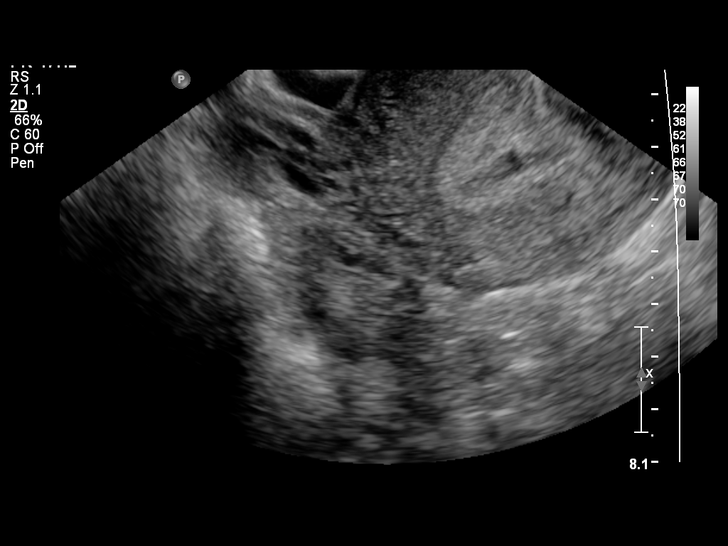
[im 48/54]
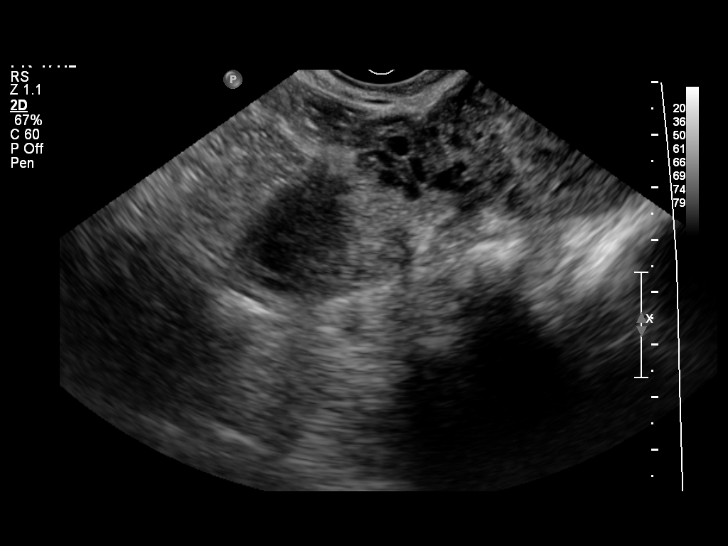
[im 52/54]
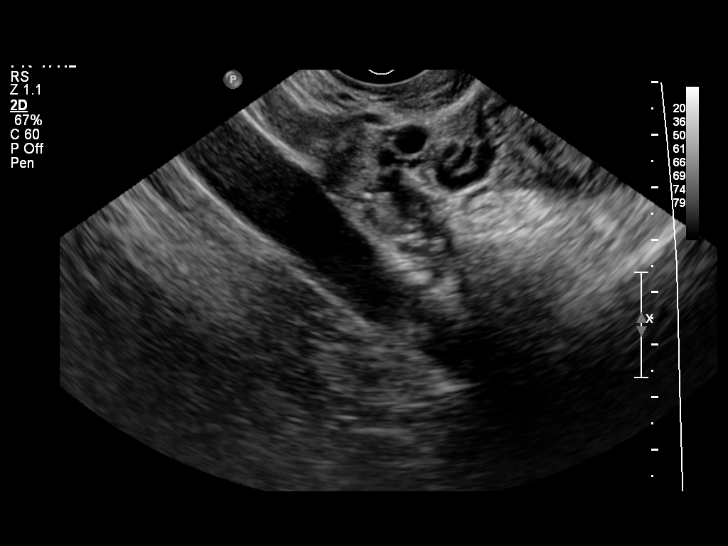

[13 of 28 positions shown; findings below may reference images not displayed]

FINDINGS: Intrauterine gestational sac: Not present.

Yolk sac: Not present.

Embryo: Not present.

Cardiac Activity: Not present.

MSD: Not applicable.

CRL: Not applicable.

US EDC: Not applicable.

Maternal uterus/adnexae: Markedly thickened, heterogeneous
endometrium measuring up to approximately 28 mm in thickness.
Submucosal fibroid involving the left posterior uterine body
measuring approximately 3.7 x 3.0 x 3.8 cm. Subserosal fibroid
involving the posterior lower uterine body measuring approximately
1.3 x 1.0 x 1.3 cm.

Normal-appearing ovaries, the right measuring approximately 3.5 x
2.2 x 2.4 cm and the left measuring approximately 3.6 x 2.5 x
cm. No adnexal masses or free pelvic fluid.
IMPRESSION: 1. No evidence of intrauterine pregnancy. Heterogeneously thickened
endometrium likely reflects hemorrhage/clot. No adnexal mass or free
pelvic fluid. Findings meet definitive criteria for failed pregnancy
as long as the LMP is certain, as there is no embryo greater than 6
weeks after LMP. This follows SRU consensus guidelines: Diagnostic
Criteria for Nonviable Pregnancy Early in the First Trimester. N
Engl J Med 2570;[DATE].
2. Uterine fibroids as detailed above.
3. Normal-appearing ovaries.

## 2015-03-21 ENCOUNTER — Other Ambulatory Visit (HOSPITAL_COMMUNITY)
Admission: RE | Admit: 2015-03-21 | Discharge: 2015-03-21 | Disposition: A | Discharge status: Home / Self Care | Payer: 59 | Source: Ambulatory Visit | Attending: Obstetrics and Gynecology | Admitting: Obstetrics and Gynecology

## 2015-03-21 ENCOUNTER — Other Ambulatory Visit: Payer: Self-pay | Admitting: Obstetrics and Gynecology

## 2015-03-21 DIAGNOSIS — Z113 Encounter for screening for infections with a predominantly sexual mode of transmission: Secondary | ICD-10-CM | POA: Diagnosis present

## 2015-03-21 DIAGNOSIS — Z01419 Encounter for gynecological examination (general) (routine) without abnormal findings: Secondary | ICD-10-CM | POA: Insufficient documentation

## 2015-03-25 LAB — CYTOLOGY - PAP

## 2015-03-29 ENCOUNTER — Inpatient Hospital Stay (HOSPITAL_COMMUNITY): Payer: 59

## 2015-03-29 ENCOUNTER — Encounter (HOSPITAL_COMMUNITY): Payer: Self-pay | Admitting: *Deleted

## 2015-03-29 ENCOUNTER — Inpatient Hospital Stay (HOSPITAL_COMMUNITY)
Admission: AD | Admit: 2015-03-29 | Discharge: 2015-03-29 | Disposition: A | Discharge status: Home / Self Care | Payer: 59 | Source: Ambulatory Visit | Attending: Obstetrics & Gynecology | Admitting: Obstetrics & Gynecology

## 2015-03-29 DIAGNOSIS — O039 Complete or unspecified spontaneous abortion without complication: Secondary | ICD-10-CM | POA: Diagnosis present

## 2015-03-29 DIAGNOSIS — O360110 Maternal care for anti-D [Rh] antibodies, first trimester, not applicable or unspecified: Secondary | ICD-10-CM

## 2015-03-29 LAB — CBC
HCT: 33.2 % — ABNORMAL LOW (ref 36.0–46.0)
Hemoglobin: 11.4 g/dL — ABNORMAL LOW (ref 12.0–15.0)
MCH: 32 pg (ref 26.0–34.0)
MCHC: 34.3 g/dL (ref 30.0–36.0)
MCV: 93.3 fL (ref 78.0–100.0)
PLATELETS: 309 10*3/uL (ref 150–400)
RBC: 3.56 MIL/uL — AB (ref 3.87–5.11)
RDW: 13.3 % (ref 11.5–15.5)
WBC: 12.6 10*3/uL — ABNORMAL HIGH (ref 4.0–10.5)

## 2015-03-29 MED ORDER — RHO D IMMUNE GLOBULIN 1500 UNIT/2ML IJ SOSY
300.0000 ug | PREFILLED_SYRINGE | Freq: Once | INTRAMUSCULAR | Status: AC
  Administered 2015-03-29: 300 ug via INTRAMUSCULAR
  Filled 2015-03-29: qty 2

## 2015-03-29 NOTE — MAU Note (Addendum)
Pt states she had miscarriage yesterday, pt states she had very heavy bleeding last night which continues this a.m.  States she almost passed out this morning.  Denies pain today.  Pt called MD office, was advised to come here.

## 2015-03-29 NOTE — Discharge Instructions (Signed)
Miscarriage  A miscarriage is the sudden loss of an unborn baby (fetus) before the 20th week of pregnancy. Most miscarriages happen in the first 3 months of pregnancy. Sometimes, it happens before a woman even knows she is pregnant. A miscarriage is also called a "spontaneous miscarriage" or "early pregnancy loss." Having a miscarriage can be an emotional experience. Talk with your caregiver about any questions you may have about miscarrying, the grieving process, and your future pregnancy plans.  CAUSES    Problems with the fetal chromosomes that make it impossible for the baby to develop normally. Problems with the baby's genes or chromosomes are most often the result of errors that occur, by chance, as the embryo divides and grows. The problems are not inherited from the parents.   Infection of the cervix or uterus.    Hormone problems.    Problems with the cervix, such as having an incompetent cervix. This is when the tissue in the cervix is not strong enough to hold the pregnancy.    Problems with the uterus, such as an abnormally shaped uterus, uterine fibroids, or congenital abnormalities.    Certain medical conditions.    Smoking, drinking alcohol, or taking illegal drugs.    Trauma.   Often, the cause of a miscarriage is unknown.   SYMPTOMS    Vaginal bleeding or spotting, with or without cramps or pain.   Pain or cramping in the abdomen or lower back.   Passing fluid, tissue, or blood clots from the vagina.  DIAGNOSIS   Your caregiver will perform a physical exam. You may also have an ultrasound to confirm the miscarriage. Blood or urine tests may also be ordered.  TREATMENT    Sometimes, treatment is not necessary if you naturally pass all the fetal tissue that was in the uterus. If some of the fetus or placenta remains in the body (incomplete miscarriage), tissue left behind may become infected and must be removed. Usually, a dilation and curettage (D and C) procedure is performed.  During a D and C procedure, the cervix is widened (dilated) and any remaining fetal or placental tissue is gently removed from the uterus.   Antibiotic medicines are prescribed if there is an infection. Other medicines may be given to reduce the size of the uterus (contract) if there is a lot of bleeding.   If you have Rh negative blood and your baby was Rh positive, you will need a Rh immunoglobulin shot. This shot will protect any future baby from having Rh blood problems in future pregnancies.  HOME CARE INSTRUCTIONS    Your caregiver may order bed rest or may allow you to continue light activity. Resume activity as directed by your caregiver.   Have someone help with home and family responsibilities during this time.    Keep track of the number of sanitary pads you use each day and how soaked (saturated) they are. Write down this information.    Do not use tampons. Do not douche or have sexual intercourse until approved by your caregiver.    Only take over-the-counter or prescription medicines for pain or discomfort as directed by your caregiver.    Do not take aspirin. Aspirin can cause bleeding.    Keep all follow-up appointments with your caregiver.    If you or your partner have problems with grieving, talk to your caregiver or seek counseling to help cope with the pregnancy loss. Allow enough time to grieve before trying to get pregnant again.     SEEK IMMEDIATE MEDICAL CARE IF:    You have severe cramps or pain in your back or abdomen.   You have a fever.   You pass large blood clots (walnut-sized or larger) ortissue from your vagina. Save any tissue for your caregiver to inspect.    Your bleeding increases.    You have a thick, bad-smelling vaginal discharge.   You become lightheaded, weak, or you faint.    You have chills.   MAKE SURE YOU:   Understand these instructions.   Will watch your condition.   Will get help right away if you are not doing well or get worse.     This  information is not intended to replace advice given to you by your health care provider. Make sure you discuss any questions you have with your health care provider.     Document Released: 11/11/2000 Document Revised: 09/12/2012 Document Reviewed: 07/07/2011  Elsevier Interactive Patient Education 2016 Elsevier Inc.

## 2015-03-29 NOTE — MAU Provider Note (Signed)
History     CSN: [id number]  Arrival date and time: 03/29/15 1044   First Provider Initiated Contact with Patient 03/29/15 1243      Chief Complaint  Patient presents with  . Vaginal Bleeding   HPI This is a 34 y.o. female at [redacted]w[redacted]d who presents with c/o heavy bleeding and cramping yesterday after having used Cytotec for a missed abortion.  States felt faint this morning. States pain and bleeding are much improved now. Was mostly worried because she "almost passed out".  Was sent here for CBC and ultrasound.   RN Note: Pt states she had miscarriage yesterday, pt states she had very heavy bleeding last night which continues this a.m. States she almost passed out this morning. Denies pain today. Pt called MD office, was advised to come here.  OB History    Gravida Para Term Preterm AB TAB SAB Ectopic Multiple Living   5 2 2  2  2   2       Past Medical History  Diagnosis Date  . Hyperemesis   . Hyperthyroidism     Past Surgical History  Procedure Laterality Date  . No past surgeries      No family history on file.  Social History  Substance Use Topics  . Smoking status: Never Smoker   . Smokeless tobacco: None  . Alcohol Use: No    Allergies: No Known Allergies  Prescriptions prior to admission  Medication Sig Dispense Refill Last Dose  . Prenatal Vit-Fe Fumarate-FA (PRENATAL MULTIVITAMIN) TABS tablet Take 1 tablet by mouth at bedtime.   03/28/2015 at Unknown time    Review of Systems  Constitutional: Negative for fever, chills and malaise/fatigue.  Gastrointestinal: Positive for abdominal pain (very mild cramping). Negative for nausea, vomiting, diarrhea and constipation.  Genitourinary:       Light bleeding   Musculoskeletal: Negative for back pain.  Neurological: Negative for dizziness (was lightheaded this morning).   Physical Exam   Blood pressure 114/72, pulse 91, temperature 98.4 F (36.9 C), temperature source Oral, resp. rate 18, last  menstrual period 01/10/2015, unknown if currently breastfeeding.  Physical Exam  Constitutional: She is oriented to person, place, and time. She appears well-developed and well-nourished. No distress.  HENT:  Head: Normocephalic.  Cardiovascular: Normal rate, regular rhythm and normal heart sounds.  Exam reveals no gallop and no friction rub.   No murmur heard. Respiratory: Effort normal and breath sounds normal.  GI: Soft. She exhibits no distension. There is no tenderness. There is no rebound and no guarding.  Genitourinary: Vaginal discharge (light bleeding) found.  Musculoskeletal: Normal range of motion.  Neurological: She is alert and oriented to person, place, and time.  Skin: Skin is warm and dry.  Psychiatric: She has a normal mood and affect.    MAU Course  Procedures  MDM CBC and [place] ordered Results for orders placed or performed during the hospital encounter of 03/29/15 (from the past 24 hour(s))  CBC     Status: Abnormal   Collection Time: 03/29/15 12:38 PM  Result Value Ref Range   WBC 12.6 (H) 4.0 - 10.5 K/uL   RBC 3.56 (L) 3.87 - 5.11 MIL/uL   Hemoglobin 11.4 (L) 12.0 - 15.0 g/dL   HCT 33.2 (L) 36.0 - 46.0 %   MCV 93.3 78.0 - 100.0 fL   MCH 32.0 26.0 - 34.0 pg   MCHC 34.3 30.0 - 36.0 g/dL   RDW 13.3 11.5 - 15.5 %   Platelets 309  150 - 400 K/uL  Rh IG workup (includes ABO/Rh)     Status: None (Preliminary result)   Collection Time: 03/29/15  2:45 PM  Result Value Ref Range   Gestational Age(Wks) 9    ABO/RH(D) O NEG    Antibody Screen NEG    Unit Number [id number]    Blood Component Type RHIG    Unit division 00    Status of Unit ISSUED    Transfusion Status OK TO TRANSFUSE    US Ob Transvaginal  03/29/2015  CLINICAL DATA:  Initial encounter for positive pregnancy test with vaginal bleeding. EXAM: OBSTETRIC <14 WK [place] AND TRANSVAGINAL OB US TECHNIQUE: Both transabdominal and transvaginal ultrasound examinations were performed for complete evaluation of  the gestation as well as the maternal uterus, adnexal regions, and pelvic cul-de-sac. Transvaginal technique was performed to assess early pregnancy. COMPARISON:  None. FINDINGS: Of note, transabdominal scanning was not performed as part of this exam. As such, detection of lesions high in the anatomic pelvis is limited. Intrauterine gestational sac: Nonvisualized. Maternal uterus/adnexae: Endometrium appears thickened and heterogeneous. There is no intrauterine gestational sac. 3.6 x 4.2 x 2.7 cm left intramural to submucosal fibroid is evident. Maternal right ovary not visualized. Maternal left ovary measures 3.7 x 3.7 x 3.2 cm on the endovaginal scanning. No adnexal mass on either side. No intraperitoneal free fluid. IMPRESSION: No evidence for intrauterine gestational sac. Given the history of a positive pregnancy test, differential considerations include intrauterine gestation too early to visualize, completed abortion, or nonvisualized ectopic pregnancy. Close clinical correlation is recommended with serial beta-hCG and followup ultrasound as warranted. 4 cm left uterine fibroid. Electronically Signed   By: Misty Stanley M.D.   On: 03/29/2015 15:00   Consulted Dr Nelda Marseille with presentation, exam findings and test results She then told me the patient needed rhophylac, so RhIg workup and Rhophylac was ordered  Assessment and Plan  A:  Complete SAB       No hemorrhage       No significant anemia       Rh Negative  P:  Discharge home       Bleeding precautions       Follow up in office for post-AB checkup  [facility] 03/29/2015, 12:43 PM

## 2015-03-30 LAB — RH IG WORKUP (INCLUDES ABO/RH)
ABO/RH(D): O NEG
Antibody Screen: NEGATIVE
GESTATIONAL AGE(WKS): 9
Unit division: 0

## 2015-06-02 NOTE — L&D Delivery Note (Signed)
Delivery Note At  a viable female was delivered via  (presentation ROA: ;  ).  APGAR:9 ,9 ; weight pending  .   Placenta status: complete, .3V  Cord:  with the following complications:variable decel during pregnancy .   Anesthesia:  Epideral, lidocaine Episiotomy:   Lacerations:  2nd degree vaginal Suture Repair: 2.0 vicryl, 4-0 vicryl Est. Blood Loss (mL):  150cc  Mom to postpartum.  Baby to Couplet care / Skin to Skin.  Pleas Koch Chrissi Crow 04/22/2016, 6:09 AM

## 2015-06-10 ENCOUNTER — Other Ambulatory Visit: Payer: Self-pay | Admitting: Obstetrics and Gynecology

## 2015-06-10 ENCOUNTER — Other Ambulatory Visit (HOSPITAL_COMMUNITY)
Admission: RE | Admit: 2015-06-10 | Discharge: 2015-06-10 | Disposition: A | Discharge status: Home / Self Care | Payer: 59 | Source: Ambulatory Visit | Attending: Obstetrics and Gynecology | Admitting: Obstetrics and Gynecology

## 2015-06-10 DIAGNOSIS — Z01419 Encounter for gynecological examination (general) (routine) without abnormal findings: Secondary | ICD-10-CM | POA: Diagnosis not present

## 2015-06-12 LAB — CYTOLOGY - PAP

## 2015-06-18 MED FILL — LEVOCETIRIZINE 5 MG TABLET: 5 | 30 days supply | Qty: 30 | Fill #4

## 2015-06-18 MED FILL — MONTELUKAST SOD 10 MG TAB: 10 | 30 days supply | Qty: 30 | Fill #4

## 2015-07-01 DIAGNOSIS — J3089 Other allergic rhinitis: Secondary | ICD-10-CM | POA: Diagnosis not present

## 2015-07-01 DIAGNOSIS — H1045 Other chronic allergic conjunctivitis: Secondary | ICD-10-CM | POA: Diagnosis not present

## 2015-07-01 DIAGNOSIS — J301 Allergic rhinitis due to pollen: Secondary | ICD-10-CM | POA: Diagnosis not present

## 2015-07-01 DIAGNOSIS — J3081 Allergic rhinitis due to animal (cat) (dog) hair and dander: Secondary | ICD-10-CM | POA: Diagnosis not present

## 2015-07-22 MED FILL — MONTELUKAST SOD 10 MG TAB: 10 | 30 days supply | Qty: 30 | Fill #5

## 2015-07-22 MED FILL — LEVOCETIRIZINE 5 MG TABLET: 5 | 30 days supply | Qty: 30 | Fill #5

## 2015-09-10 MED FILL — LEVOCETIRIZINE 5 MG TABLET: 5 | 30 days supply | Qty: 30 | Fill #0

## 2015-09-10 MED FILL — MONTELUKAST SOD 10 MG TAB: 10 | 30 days supply | Qty: 30 | Fill #0

## 2015-09-20 DIAGNOSIS — Z3480 Encounter for supervision of other normal pregnancy, unspecified trimester: Secondary | ICD-10-CM | POA: Diagnosis not present

## 2015-09-25 DIAGNOSIS — O09291 Supervision of pregnancy with other poor reproductive or obstetric history, first trimester: Secondary | ICD-10-CM | POA: Diagnosis not present

## 2015-11-13 MED FILL — MONTELUKAST SOD 10 MG TAB: 10 | 30 days supply | Qty: 30 | Fill #1

## 2015-11-13 MED FILL — LEVOCETIRIZINE 5 MG TABLET: 5 | 30 days supply | Qty: 30 | Fill #1

## 2015-11-20 DIAGNOSIS — Z3482 Encounter for supervision of other normal pregnancy, second trimester: Secondary | ICD-10-CM | POA: Diagnosis not present

## 2015-11-20 DIAGNOSIS — Z36 Encounter for antenatal screening of mother: Secondary | ICD-10-CM | POA: Diagnosis not present

## 2016-01-17 DIAGNOSIS — Z3480 Encounter for supervision of other normal pregnancy, unspecified trimester: Secondary | ICD-10-CM | POA: Diagnosis not present

## 2016-01-20 MED FILL — LEVOCETIRIZINE 5 MG TABLET: 5 | 30 days supply | Qty: 30 | Fill #2

## 2016-01-20 MED FILL — MONTELUKAST SOD 10 MG TAB: 10 | 30 days supply | Qty: 30 | Fill #2

## 2016-02-01 ENCOUNTER — Encounter (HOSPITAL_COMMUNITY): Payer: Self-pay

## 2016-02-11 DIAGNOSIS — O09293 Supervision of pregnancy with other poor reproductive or obstetric history, third trimester: Secondary | ICD-10-CM | POA: Diagnosis not present

## 2016-02-11 DIAGNOSIS — Z23 Encounter for immunization: Secondary | ICD-10-CM | POA: Diagnosis not present

## 2016-02-25 DIAGNOSIS — Z23 Encounter for immunization: Secondary | ICD-10-CM | POA: Diagnosis not present

## 2016-03-02 DIAGNOSIS — Z3A33 33 weeks gestation of pregnancy: Secondary | ICD-10-CM | POA: Diagnosis not present

## 2016-03-02 DIAGNOSIS — O3413 Maternal care for benign tumor of corpus uteri, third trimester: Secondary | ICD-10-CM | POA: Diagnosis not present

## 2016-03-18 DIAGNOSIS — O368130 Decreased fetal movements, third trimester, not applicable or unspecified: Secondary | ICD-10-CM | POA: Diagnosis not present

## 2016-03-23 DIAGNOSIS — Z3483 Encounter for supervision of other normal pregnancy, third trimester: Secondary | ICD-10-CM | POA: Diagnosis not present

## 2016-04-01 MED FILL — MONTELUKAST SOD 10 MG TAB: 10 | 30 days supply | Qty: 30 | Fill #3

## 2016-04-17 ENCOUNTER — Other Ambulatory Visit (HOSPITAL_COMMUNITY): Payer: Self-pay | Admitting: Obstetrics and Gynecology

## 2016-04-17 DIAGNOSIS — O480 Post-term pregnancy: Secondary | ICD-10-CM

## 2016-04-21 ENCOUNTER — Other Ambulatory Visit: Payer: Self-pay | Admitting: Obstetrics and Gynecology

## 2016-04-21 ENCOUNTER — Inpatient Hospital Stay (HOSPITAL_COMMUNITY): Payer: 59 | Admitting: Anesthesiology

## 2016-04-21 ENCOUNTER — Encounter (HOSPITAL_COMMUNITY): Payer: Self-pay | Admitting: *Deleted

## 2016-04-21 ENCOUNTER — Encounter (HOSPITAL_COMMUNITY): Payer: Self-pay

## 2016-04-21 ENCOUNTER — Ambulatory Visit (HOSPITAL_COMMUNITY): Payer: 59

## 2016-04-21 ENCOUNTER — Inpatient Hospital Stay (HOSPITAL_COMMUNITY)
Admission: AD | Admit: 2016-04-21 | Discharge: 2016-04-24 | DRG: 775 | Disposition: A | Discharge status: Home / Self Care | Payer: 59 | Source: Ambulatory Visit | Attending: Obstetrics and Gynecology | Admitting: Obstetrics and Gynecology

## 2016-04-21 DIAGNOSIS — N90810 Female genital mutilation status, unspecified: Secondary | ICD-10-CM | POA: Diagnosis present

## 2016-04-21 DIAGNOSIS — D259 Leiomyoma of uterus, unspecified: Secondary | ICD-10-CM | POA: Diagnosis present

## 2016-04-21 DIAGNOSIS — Z6791 Unspecified blood type, Rh negative: Secondary | ICD-10-CM

## 2016-04-21 DIAGNOSIS — O3483 Maternal care for other abnormalities of pelvic organs, third trimester: Secondary | ICD-10-CM | POA: Diagnosis present

## 2016-04-21 DIAGNOSIS — O480 Post-term pregnancy: Principal | ICD-10-CM | POA: Diagnosis present

## 2016-04-21 DIAGNOSIS — O26893 Other specified pregnancy related conditions, third trimester: Secondary | ICD-10-CM | POA: Diagnosis present

## 2016-04-21 DIAGNOSIS — O3413 Maternal care for benign tumor of corpus uteri, third trimester: Secondary | ICD-10-CM | POA: Diagnosis present

## 2016-04-21 DIAGNOSIS — E0590 Thyrotoxicosis, unspecified without thyrotoxic crisis or storm: Secondary | ICD-10-CM | POA: Diagnosis not present

## 2016-04-21 DIAGNOSIS — Z3A00 Weeks of gestation of pregnancy not specified: Secondary | ICD-10-CM | POA: Diagnosis not present

## 2016-04-21 DIAGNOSIS — Z3A40 40 weeks gestation of pregnancy: Secondary | ICD-10-CM | POA: Diagnosis not present

## 2016-04-21 DIAGNOSIS — Z3A41 41 weeks gestation of pregnancy: Secondary | ICD-10-CM | POA: Diagnosis not present

## 2016-04-21 DIAGNOSIS — O99284 Endocrine, nutritional and metabolic diseases complicating childbirth: Secondary | ICD-10-CM | POA: Diagnosis not present

## 2016-04-21 HISTORY — DX: Benign neoplasm of connective and other soft tissue, unspecified: D21.9

## 2016-04-21 LAB — OB RESULTS CONSOLE RPR: RPR: NONREACTIVE

## 2016-04-21 LAB — TYPE AND SCREEN
ABO/RH(D): O NEG
Antibody Screen: NEGATIVE

## 2016-04-21 LAB — CBC
HCT: 34.3 % — ABNORMAL LOW (ref 36.0–46.0)
Hemoglobin: 11.6 g/dL — ABNORMAL LOW (ref 12.0–15.0)
MCH: 31.1 pg (ref 26.0–34.0)
MCHC: 33.8 g/dL (ref 30.0–36.0)
MCV: 92 fL (ref 78.0–100.0)
Platelets: 290 10*3/uL (ref 150–400)
RBC: 3.73 MIL/uL — ABNORMAL LOW (ref 3.87–5.11)
RDW: 14 % (ref 11.5–15.5)
WBC: 10.3 10*3/uL (ref 4.0–10.5)

## 2016-04-21 LAB — OB RESULTS CONSOLE HIV ANTIBODY (ROUTINE TESTING): HIV: NONREACTIVE

## 2016-04-21 LAB — OB RESULTS CONSOLE GC/CHLAMYDIA
CHLAMYDIA, DNA PROBE: NEGATIVE
Gonorrhea: NEGATIVE

## 2016-04-21 MED ORDER — LIDOCAINE HCL (PF) 1 % IJ SOLN
INTRAMUSCULAR | Status: DC | PRN
  Administered 2016-04-21: 4 mL via EPIDURAL

## 2016-04-21 MED ORDER — OXYTOCIN 40 UNITS IN LACTATED RINGERS INFUSION - SIMPLE MED
2.5000 [IU]/h | INTRAVENOUS | Status: DC
  Administered 2016-04-22: 2.5 [IU]/h via INTRAVENOUS
  Filled 2016-04-21: qty 1000

## 2016-04-21 MED ORDER — DIPHENHYDRAMINE HCL 50 MG/ML IJ SOLN: 12.5000 mg | INTRAMUSCULAR | Status: DC | PRN

## 2016-04-21 MED ORDER — FENTANYL 2.5 MCG/ML BUPIVACAINE 1/10 % EPIDURAL INFUSION (WH - ANES)
INTRAMUSCULAR | Status: AC
  Filled 2016-04-21: qty 100

## 2016-04-21 MED ORDER — MISOPROSTOL 25 MCG QUARTER TABLET
25.0000 ug | ORAL_TABLET | ORAL | Status: DC | PRN
  Administered 2016-04-21: 25 ug via VAGINAL
  Filled 2016-04-21: qty 0.25

## 2016-04-21 MED ORDER — LACTATED RINGERS IV SOLN
INTRAVENOUS | Status: DC
  Administered 2016-04-21: 20:00:00 via INTRAUTERINE

## 2016-04-21 MED ORDER — OXYCODONE-ACETAMINOPHEN 5-325 MG PO TABS: 1.0000 | ORAL_TABLET | ORAL | Status: DC | PRN

## 2016-04-21 MED ORDER — ONDANSETRON HCL 4 MG/2ML IJ SOLN: 4.0000 mg | Freq: Four times a day (QID) | INTRAMUSCULAR | Status: DC | PRN

## 2016-04-21 MED ORDER — PHENYLEPHRINE 40 MCG/ML (10ML) SYRINGE FOR IV PUSH (FOR BLOOD PRESSURE SUPPORT): 80.0000 ug | PREFILLED_SYRINGE | INTRAVENOUS | Status: DC | PRN

## 2016-04-21 MED ORDER — LACTATED RINGERS IV SOLN: 500.0000 mL | INTRAVENOUS | Status: DC | PRN

## 2016-04-21 MED ORDER — FENTANYL CITRATE (PF) 100 MCG/2ML IJ SOLN
50.0000 ug | INTRAMUSCULAR | Status: DC | PRN
  Administered 2016-04-21 (×2): 100 ug via INTRAVENOUS
  Filled 2016-04-21 (×2): qty 2

## 2016-04-21 MED ORDER — EPHEDRINE 5 MG/ML INJ: 10.0000 mg | INTRAVENOUS | Status: DC | PRN

## 2016-04-21 MED ORDER — SOD CITRATE-CITRIC ACID 500-334 MG/5ML PO SOLN
30.0000 mL | ORAL | Status: DC | PRN
  Administered 2016-04-21: 30 mL via ORAL
  Filled 2016-04-21: qty 15

## 2016-04-21 MED ORDER — LACTATED RINGERS IV SOLN: 500.0000 mL | Freq: Once | INTRAVENOUS | Status: DC

## 2016-04-21 MED ORDER — OXYTOCIN BOLUS FROM INFUSION
500.0000 mL | Freq: Once | INTRAVENOUS | Status: AC
  Administered 2016-04-22: 500 mL via INTRAVENOUS

## 2016-04-21 MED ORDER — FENTANYL 2.5 MCG/ML BUPIVACAINE 1/10 % EPIDURAL INFUSION (WH - ANES)
14.0000 mL/h | INTRAMUSCULAR | Status: DC | PRN
  Administered 2016-04-21: 14 mL/h via EPIDURAL
  Filled 2016-04-21: qty 100

## 2016-04-21 MED ORDER — PHENYLEPHRINE 40 MCG/ML (10ML) SYRINGE FOR IV PUSH (FOR BLOOD PRESSURE SUPPORT)
PREFILLED_SYRINGE | INTRAVENOUS | Status: AC
  Filled 2016-04-21: qty 10

## 2016-04-21 MED ORDER — LACTATED RINGERS IV SOLN
INTRAVENOUS | Status: DC
  Administered 2016-04-21 – 2016-04-22 (×2): via INTRAVENOUS

## 2016-04-21 MED ORDER — LIDOCAINE HCL (PF) 1 % IJ SOLN
30.0000 mL | INTRAMUSCULAR | Status: AC | PRN
  Administered 2016-04-22: 30 mL via SUBCUTANEOUS
  Filled 2016-04-21: qty 30

## 2016-04-21 MED ORDER — TERBUTALINE SULFATE 1 MG/ML IJ SOLN: 0.2500 mg | Freq: Once | INTRAMUSCULAR | Status: DC | PRN

## 2016-04-21 MED ORDER — OXYCODONE-ACETAMINOPHEN 5-325 MG PO TABS: 2.0000 | ORAL_TABLET | ORAL | Status: DC | PRN

## 2016-04-21 MED ORDER — ACETAMINOPHEN 325 MG PO TABS: 650.0000 mg | ORAL_TABLET | ORAL | Status: DC | PRN

## 2016-04-21 NOTE — Progress Notes (Signed)
Subjective: Starting to feel cramping.  Called to room by RN due to decels with contractions.     Objective: BP 123/78 (BP Location: Right Arm)   Pulse 95   Temp 97 F (36.1 C) (Axillary)   Resp 16   Ht 5\' 4"  (1.626 m)   Wt 97.5 kg (215 lb)   SpO2 99%   BMI 36.90 kg/m  No intake/output data recorded. No intake/output data recorded.  FHT: Category 2 UC:   irregular, every 4 minutes SVE:   Dilation: 2 Effacement (%): Thick Station: -3 Exam by:: N. Briarrose Shor CNM AROM clear fluid.  IUPC inserted, scalp applied without difficulty.  Amnioinfusion started.    Assessment:  Pt is a [id number] at 40.6 induction for BPP 6/8 with prolonged decels Cat 2 strip  Plan: After interventions Cat 1 strip.  Dr. Alesia Richards aware of poc. Will monitor progress.  Starla Link CNM, MSN 04/21/2016, 7:51 PM

## 2016-04-21 NOTE — Anesthesia Procedure Notes (Signed)
Epidural Patient location during procedure: OB Start time: 04/21/2016 11:13 PM End time: 04/21/2016 11:19 PM  Staffing Anesthesiologist: Suella Broad D Performed: anesthesiologist   Preanesthetic Checklist Completed: patient identified, site marked, surgical consent, pre-op evaluation, timeout performed, IV checked, risks and benefits discussed and monitors and equipment checked  Epidural Patient position: sitting Prep: ChloraPrep Patient monitoring: heart rate, continuous pulse ox and blood pressure Approach: midline Location: L3-L4 Injection technique: LOR saline  Needle:  Needle type: Tuohy  Needle gauge: 17 G Needle length: 9 cm Catheter type: closed end flexible Catheter size: 20 Guage Test dose: negative and 1.5% lidocaine  Assessment Events: blood not aspirated, injection not painful, no injection resistance and no paresthesia  Additional Notes LOR @ 4.5  Patient identified. Risks/Benefits/Options discussed with patient including but not limited to bleeding, infection, nerve damage, paralysis, failed block, incomplete pain control, headache, blood pressure changes, nausea, vomiting, reactions to medications, itching and postpartum back pain. Confirmed with bedside nurse the patient's most recent platelet count. Confirmed with patient that they are not currently taking any anticoagulation, have any bleeding history or any family history of bleeding disorders. Patient expressed understanding and wished to proceed. All questions were answered. Sterile technique was used throughout the entire procedure. Please see nursing notes for vital signs. Test dose was given through epidural catheter and negative prior to continuing to dose epidural or start infusion. Warning signs of high block given to the patient including shortness of breath, tingling/numbness in hands, complete motor block, or any concerning symptoms with instructions to call for help. Patient was given instructions  on fall risk and not to get out of bed. All questions and concerns addressed with instructions to call with any issues or inadequate analgesia.    Reason for block:procedure for pain

## 2016-04-21 NOTE — Anesthesia Pain Management Evaluation Note (Signed)
  CRNA Pain Management Visit Note  Patient: Margaret Richards, 35 y.o., female  "Hello I am a member of the anesthesia team at [facility]. We have an anesthesia team available at all times to provide care throughout the hospital, including epidural management and anesthesia for C-section. I don't know your plan for the delivery whether it a natural birth, water birth, IV sedation, nitrous supplementation, doula or epidural, but we want to meet your pain goals."   1.Was your pain managed to your expectations on prior hospitalizations?   Yes   2.What is your expectation for pain management during this hospitalization?     Labor support without medications and Epidural not sure of plan.  3.How can we help you reach that goal? Let me be flexible with my plans.  Record the patient's initial score and the patient's pain goal.   Pain: 9  Pain Goal: 10 [facility] wants you to be able to say your pain was always managed very well.  Margaret Richards 04/21/2016

## 2016-04-21 NOTE — Anesthesia Preprocedure Evaluation (Signed)
Anesthesia Evaluation   Patient awake    Reviewed: Allergy & Precautions, Patient's Chart, lab work & pertinent test results  Airway Mallampati: II       Dental  (+) Teeth Intact   Pulmonary neg pulmonary ROS,    breath sounds clear to auscultation       Cardiovascular negative cardio ROS   Rhythm:Regular Rate:Normal     Neuro/Psych negative neurological ROS  negative psych ROS   GI/Hepatic negative GI ROS, Neg liver ROS,   Endo/Other  Hyperthyroidism   Renal/GU negative Renal ROS  negative genitourinary   Musculoskeletal negative musculoskeletal ROS (+)   Abdominal   Peds negative pediatric ROS (+)  Hematology negative hematology ROS (+)   Anesthesia Other Findings   Reproductive/Obstetrics (+) Pregnancy                             Lab Results  Component Value Date   WBC 10.3 04/21/2016   HGB 11.6 (L) 04/21/2016   HCT 34.3 (L) 04/21/2016   MCV 92.0 04/21/2016   PLT 290 04/21/2016   No results found for: INR, PROTIME   Anesthesia Physical Anesthesia Plan  ASA: II  Anesthesia Plan: Epidural   Post-op Pain Management:    Induction:   Airway Management Planned:   Additional Equipment:   Intra-op Plan:   Post-operative Plan:   Informed Consent: I have reviewed the patients History and Physical, chart, labs and discussed the procedure including the risks, benefits and alternatives for the proposed anesthesia with the patient or authorized representative who has indicated his/her understanding and acceptance.     Plan Discussed with:   Anesthesia Plan Comments:         Anesthesia Quick Evaluation

## 2016-04-21 NOTE — H&P (Signed)
Margaret Richards is a 35 y.o. female at 40 wks and 6 days with EDD 04/15/2016 is presenting for induction due to  6 out of 8 BPP in the office. BPP was performed due to postdates.  Pregnancy has been uncomplicated. U/s in the office today fetus was 7 lbs and 6 oz. Pt denies contractions/ no lof no vaginal bleeding.   OB History    Gravida Para Term Preterm AB Living   6 2 2   2 2    SAB TAB Ectopic Multiple Live Births   2       1     Past Medical History:  Diagnosis Date  . Fibroid   . Hyperemesis   . Hyperthyroidism    Past Surgical History:  Procedure Laterality Date  . female circumcision    . NO PAST SURGERIES     Family History: family history is not on file. Social History:  reports that she has never smoked. She does not have any smokeless tobacco history on file. She reports that she does not drink alcohol or use drugs.     Maternal Diabetes: No Genetic Screening: Normal Maternal Ultrasounds/Referrals: Normal Fetal Ultrasounds or other Referrals:  None Maternal Substance Abuse:  No Significant Maternal Medications:  None Significant Maternal Lab Results:  Lab values include: Group B Strep negative Other Comments:  None  Review of Systems  Constitutional: Negative.   HENT: Negative.   Eyes: Negative.   Respiratory: Negative.   Cardiovascular: Negative.   Gastrointestinal: Negative.   Genitourinary: Negative.   Musculoskeletal: Negative.   Skin: Negative.   Neurological: Negative.   Endo/Heme/Allergies: Negative.   Psychiatric/Behavioral: Negative.    Maternal Medical History:  Contractions: Frequency: rare.    Fetal activity: Perceived fetal activity is normal.   Last perceived fetal movement was within the past hour.    Prenatal complications: no prenatal complications Prenatal Complications - Diabetes: none.      Blood pressure 123/78, pulse 95, temperature 99.4 F (37.4 C), temperature source Axillary, resp. rate 16, height 5\' 4"  (1.626  m), weight 97.5 kg (215 lb), SpO2 99 %, unknown if currently breastfeeding. Maternal Exam:  Uterine Assessment: Contraction strength is mild.  Contraction frequency is rare.   Abdomen: Patient reports no abdominal tenderness. Estimated fetal weight is  7 lbs 6 oz by ultrasound today .   Fetal presentation: vertex  Introitus: Normal vulva. Normal vagina.    Fetal Exam Fetal Monitor Review: Baseline rate: 140.  Variability: moderate (6-25 bpm).   Pattern: accelerations present.    Fetal State Assessment: Category I - tracings are normal.     Physical Exam  Vitals reviewed. Constitutional: She is oriented to person, place, and time. She appears well-developed and well-nourished.  HENT:  Head: Normocephalic and atraumatic.  Eyes: Conjunctivae are normal. Pupils are equal, round, and reactive to light.  Neck: Normal range of motion. Neck supple.  Cardiovascular: Normal rate and regular rhythm.   Respiratory: Effort normal and breath sounds normal.  GI: There is no tenderness.  Genitourinary: Vagina normal.  Genitourinary Comments: Female Circumcision   Musculoskeletal: Normal range of motion. She exhibits no edema.  Neurological: She is alert and oriented to person, place, and time. She has normal reflexes.  Skin: Skin is warm and dry.  Psychiatric: She has a normal mood and affect.   Cervix 1.5/Thick / High    Prenatal labs: ABO, Rh: --/--/O NEG (11/21 1755) Antibody: PENDING (11/21 1755) Rubella:  Immune  RPR: Nonreactive (11/21 1748)  HBsAg:   Negative  HIV: Non-reactive (11/21 1748)  GBS:    Negative  Assessment/Plan: 40 wks and 6 days with BPP 6 out 8 in the office admitted for induction due to nonreassuring fetal testing postdates. Fetal Heart Rate Tracing Category 1  Cytotec for cervical ripening.  Female Circumcision=- Pt desires episiotomy at time of delivery to avoid tearing of female circumcision.  RH Negative. rhogham post delivery as indicated.   Fibroids  posterior 5 cm and anterior 3 cm.. Should not interfere with delivery.  Anticipate SVD  CCOB cover after 7pm . Dr. Simona Huh to assume care at 7 am    Niambi Smoak J. 04/21/2016, 6:32 PM

## 2016-04-22 ENCOUNTER — Encounter (HOSPITAL_COMMUNITY): Payer: Self-pay

## 2016-04-22 LAB — RPR: RPR: NONREACTIVE

## 2016-04-22 MED ORDER — PRENATAL MULTIVITAMIN CH: 1.0000 | ORAL_TABLET | Freq: Every day | ORAL | Status: DC

## 2016-04-22 MED ORDER — ONDANSETRON HCL 4 MG/2ML IJ SOLN: 4.0000 mg | INTRAMUSCULAR | Status: DC | PRN

## 2016-04-22 MED ORDER — WITCH HAZEL-GLYCERIN EX PADS: 1.0000 "application " | MEDICATED_PAD | CUTANEOUS | Status: DC | PRN

## 2016-04-22 MED ORDER — TETANUS-DIPHTH-ACELL PERTUSSIS 5-2.5-18.5 LF-MCG/0.5 IM SUSP: 0.5000 mL | Freq: Once | INTRAMUSCULAR | Status: DC

## 2016-04-22 MED ORDER — ACETAMINOPHEN 325 MG PO TABS
650.0000 mg | ORAL_TABLET | ORAL | Status: DC | PRN
  Administered 2016-04-23 (×2): 650 mg via ORAL
  Filled 2016-04-22 (×2): qty 2

## 2016-04-22 MED ORDER — PRENATAL MULTIVITAMIN CH
1.0000 | ORAL_TABLET | Freq: Every day | ORAL | Status: DC
  Administered 2016-04-23 – 2016-04-24 (×2): 1 via ORAL
  Filled 2016-04-22 (×2): qty 1

## 2016-04-22 MED ORDER — BENZOCAINE-MENTHOL 20-0.5 % EX AERO
1.0000 "application " | INHALATION_SPRAY | CUTANEOUS | Status: DC | PRN
  Administered 2016-04-22: 1 via TOPICAL
  Filled 2016-04-22 (×2): qty 56

## 2016-04-22 MED ORDER — SIMETHICONE 80 MG PO CHEW: 80.0000 mg | CHEWABLE_TABLET | ORAL | Status: DC | PRN

## 2016-04-22 MED ORDER — OXYTOCIN 40 UNITS IN LACTATED RINGERS INFUSION - SIMPLE MED
1.0000 m[IU]/min | INTRAVENOUS | Status: DC
  Administered 2016-04-22: 2 m[IU]/min via INTRAVENOUS

## 2016-04-22 MED ORDER — IBUPROFEN 600 MG PO TABS
600.0000 mg | ORAL_TABLET | Freq: Four times a day (QID) | ORAL | Status: DC
  Administered 2016-04-22 – 2016-04-24 (×9): 600 mg via ORAL
  Filled 2016-04-22 (×9): qty 1

## 2016-04-22 MED ORDER — SENNOSIDES-DOCUSATE SODIUM 8.6-50 MG PO TABS
2.0000 | ORAL_TABLET | ORAL | Status: DC
  Administered 2016-04-23 – 2016-04-24 (×2): 2 via ORAL
  Filled 2016-04-22 (×2): qty 2

## 2016-04-22 MED ORDER — COCONUT OIL OIL
1.0000 "application " | TOPICAL_OIL | Status: DC | PRN
  Administered 2016-04-24: 1 via TOPICAL
  Filled 2016-04-22 (×2): qty 120

## 2016-04-22 MED ORDER — ONDANSETRON HCL 4 MG PO TABS: 4.0000 mg | ORAL_TABLET | ORAL | Status: DC | PRN

## 2016-04-22 MED ORDER — DIBUCAINE 1 % RE OINT
1.0000 "application " | TOPICAL_OINTMENT | RECTAL | Status: DC | PRN
  Filled 2016-04-22: qty 28

## 2016-04-22 MED ORDER — ZOLPIDEM TARTRATE 5 MG PO TABS: 5.0000 mg | ORAL_TABLET | Freq: Every evening | ORAL | Status: DC | PRN

## 2016-04-22 MED ORDER — DIPHENHYDRAMINE HCL 25 MG PO CAPS: 25.0000 mg | ORAL_CAPSULE | Freq: Four times a day (QID) | ORAL | Status: DC | PRN

## 2016-04-22 NOTE — Lactation Note (Signed)
This note was copied from a baby's chart. Lactation Consultation Note  Patient Name: Margaret Richards Today's Date: 04/22/2016 Reason for consult: Initial assessment   Initial consult at 42 hrs old; GA 41.0; BW 7 lbs, 5.5 oz.  Vaginal delivery.  Mom is a P3 with 4-8 months breastfeeding experience with 2 previous children.  RN states mom is Breast/Bottle.   No documented feedings at time of visit; voids-1; stools-0.  No LS recorded.   Mom was holding wrapped infant when [facility] entered room.  Mom stated she doesn't have much milk.  Educated on supply & demand; encouraged exclusive breastfeeding to establish a good milk supply for infant.   LC taught hand expression with observation of colostrum easily flowing.  Mom stated she could latch baby.  LC encouraged unwrapping infant and placing STS with mom with feedings.  [place] assisted with STS.  Discussed importance of keeping infant STS with mom or dad.  Assisted with attempted latching and correct positioning.  Infant too sleepy for latching.   Educated on feeding with cues, cluster feeding, and size of infant's stomach.  Educated that infant should feed 8-12 times per day. Mom spoke very quietly and it was difficult for [facility] to hear her brief answers to questions asked.   Lactation brochure given and informed of hospital support group and outpatient services.   Encouraged to call for assistance with feedings.     Maternal Data Has patient been taught Hand Expression?: Yes Does the patient have breastfeeding experience prior to this delivery?: Yes  Feeding Feeding Type: Breast Fed Length of feed: 0 min  LATCH Score/Interventions Latch: Too sleepy or reluctant, no latch achieved, no sucking elicited.     Type of Nipple: Everted at rest and after stimulation  Comfort (Breast/Nipple): Soft / non-tender     Hold (Positioning): Assistance needed to correctly position infant at breast and maintain latch.     Lactation Tools  Discussed/Used     Consult Status Consult Status: Follow-up Date: 04/23/16 Follow-up type: In-patient    Merlene Laughter 04/22/2016, 11:41 AM

## 2016-04-22 NOTE — Progress Notes (Addendum)
Postpartum day #0, NSVD  In to say congratulations.  No complaints.   VS reviewed.  Routine ob care.

## 2016-04-22 NOTE — Progress Notes (Signed)
Subjective: Pt comfortable with epidural. Called to room per RN for variable decel and to evaluate IUPC placement.   Objective: BP 106/83   Pulse (!) 133   Temp 98 F (36.7 C) (Oral)   Resp 16   Ht 5\' 4"  (1.626 m)   Wt 97.5 kg (215 lb)   SpO2 96%   BMI 36.90 kg/m  No intake/output data recorded. Total I/O In: -  Out: 400 [Urine:400]  FHT: Category 2 UC:   regular, every 4-5 minutes lasting 50 SVE:   Dilation: 4.5 Effacement (%): 60 Station: -1 Exam by:: N. Prothero CNM  MVUs not adequate  Assessment:  Pt is a [id number] at 41 weeks IUP in active labor Cat 2 strip Plan: IUPC adjusted.  Pt on side with peanut ball.  Amnioinfusion going.  Will start pitocin augmentation at 1 mu and monitor fetal status.    [name] CNM, MSN 04/22/2016, 1:12 AM

## 2016-04-22 NOTE — Anesthesia Postprocedure Evaluation (Signed)
Anesthesia Post Note  Patient: Margaret Richards  Procedure(s) Performed: * No procedures listed *  Patient location during evaluation: Mother Baby Anesthesia Type: Epidural Level of consciousness: awake and alert Pain management: satisfactory to patient Vital Signs Assessment: post-procedure vital signs reviewed and stable Respiratory status: respiratory function stable Cardiovascular status: stable Postop Assessment: no headache, no backache, epidural receding, patient able to bend at knees, no signs of nausea or vomiting and adequate PO intake Anesthetic complications: no     Last Vitals:  Vitals:   04/22/16 0949 04/22/16 1358  BP: (!) 118/57 102/75  Pulse: 94 93  Resp: 12 16  Temp: 36.9 C 36.8 C    Last Pain:  Vitals:   04/22/16 1358  TempSrc: Oral  PainSc: 4    Pain Goal: Patients Stated Pain Goal: 2 (04/21/16 1807)               Katherina Mires

## 2016-04-23 LAB — CBC
HEMATOCRIT: 30.1 % — AB (ref 36.0–46.0)
Hemoglobin: 10.4 g/dL — ABNORMAL LOW (ref 12.0–15.0)
MCH: 31.6 pg (ref 26.0–34.0)
MCHC: 34.6 g/dL (ref 30.0–36.0)
MCV: 91.5 fL (ref 78.0–100.0)
Platelets: 269 10*3/uL (ref 150–400)
RBC: 3.29 MIL/uL — ABNORMAL LOW (ref 3.87–5.11)
RDW: 14.2 % (ref 11.5–15.5)
WBC: 14.4 10*3/uL — ABNORMAL HIGH (ref 4.0–10.5)

## 2016-04-23 MED ORDER — RHO D IMMUNE GLOBULIN 1500 UNIT/2ML IJ SOSY
300.0000 ug | PREFILLED_SYRINGE | Freq: Once | INTRAMUSCULAR | Status: AC
  Administered 2016-04-23: 300 ug via INTRAMUSCULAR
  Filled 2016-04-23: qty 2

## 2016-04-23 NOTE — Progress Notes (Signed)
Subjective: Postpartum Day 1: Vaginal delivery, 2nd laceration Patient up ad lib, reports no syncope or dizziness. Feeding:  breast Contraceptive plan:  undecided  Objective: Vital signs in last 24 hours: Temp:  [97.9 F (36.6 C)-98.3 F (36.8 C)] 98.1 F (36.7 C) (11/23 0525) Pulse Rate:  [79-93] 79 (11/23 0525) Resp:  [16-18] 18 (11/23 0525) BP: (102-107)/(59-75) 107/59 (11/23 0525) SpO2:  [94 %] 94 % (11/22 1358)  Physical Exam:  General: alert, cooperative and no distress Lochia: appropriate Uterine Fundus: firm Perineum: healing well DVT Evaluation: No evidence of DVT seen on physical exam. Negative Homan's sign.   CBC Latest Ref Rng & Units 04/23/2016 04/21/2016 03/29/2015  WBC 4.0 - 10.5 K/uL 14.4(H) 10.3 12.6(H)  Hemoglobin 12.0 - 15.0 g/dL 10.4(L) 11.6(L) 11.4(L)  Hematocrit 36.0 - 46.0 % 30.1(L) 34.3(L) 33.2(L)  Platelets 150 - 400 K/uL 269 290 309     Assessment/Plan: Status post vaginal delivery day 1. Stable Continue current care. Plan for discharge tomorrow and Breastfeeding    Pleas Koch ProtheroCNM 04/23/2016, 11:45 AM

## 2016-04-23 NOTE — Lactation Note (Signed)
This note was copied from a baby's chart. Lactation Consultation Note  Patient Name: Girl Rosmarie Rubano Today's Date: 04/23/2016 Reason for consult: Follow-up assessment Infant is 36 hours & seen by [facility] for follow-up assessment. Mom's visitor called LC because they were about to attempt BF. When LC entered, baby was asleep, wrapped up with mom. Encouraged mom to unwrap baby & mom tried to latch baby but she was still asleep & would not open her mouth to suckle. LC offered to try to wake her up- undressed baby down to a diaper & tried to get baby to suck on my gloved finger; baby did wake up & so mom again tried to latch. Encouraged mom to hand express some milk & then try to latch. Baby did latch & suckle but mom reported pain so encouraged mom to unlatch & relatch but to wait until baby opens her mouth very wide before bringing baby to breast. Mom had a few pillows on her lap (was sitting in chair) but mom was leaning forward & baby was not close into her breast/body. Provided mom with more pillows to encourage her to lean back & relax while bringing baby closer so baby didn't pull on her nipple. Mom reported more comfort and swallows were noted. Baby BF for ~15 mins and then fell asleep. Encouraged mom to try to burp baby & then offer her left breast; LC left while mom was burping baby. Mom's visitor was asking about what mom could do to increase her supply because she stated mom's milk dried up after ~4 months with her previous child and mom wishes to BF longer this time; visitor was asking about any teas or food that mom could take/ eat. Discussed how stimulation is the number 1 thing to help with milk supply & how the goal is to have baby BF with feeding cues at least 8-12x in 24hrs. Discussed how some moms have found success with Mother's Milk Tea but for now for mom to focus on keeping baby at breast.  Encouraged mom to ask her nurse or for [facility] if help is needed at future feedings.  Maternal  Data    Feeding Feeding Type: Breast Fed Length of feed: 15 min  LATCH Score/Interventions Latch: Repeated attempts needed to sustain latch, nipple held in mouth throughout feeding, stimulation needed to elicit sucking reflex. Intervention(s): Waking techniques;Skin to skin Intervention(s): Assist with latch;Adjust position  Audible Swallowing: A few with stimulation Intervention(s): Hand expression;Skin to skin  Type of Nipple: Everted at rest and after stimulation  Comfort (Breast/Nipple): Soft / non-tender     Hold (Positioning): Assistance needed to correctly position infant at breast and maintain latch. Intervention(s): Support Pillows  LATCH Score: 7  Lactation Tools Discussed/Used WIC Program: Yes   Consult Status Consult Status: Follow-up Date: 04/24/16 Follow-up type: In-patient    [name] 04/23/2016, 7:30 PM

## 2016-04-23 NOTE — Lactation Note (Signed)
This note was copied from a baby's chart. Lactation Consultation Note  Patient Name: Margaret Richards Today's Date: 04/23/2016 Reason for consult: Follow-up assessment Infant is 68 hours old & seen by [facility] for follow-up assessment. Baby was asleep with a visitor when [facility] entered. Mom stated BF is going well & last feeding was for ~40 mins but that she has some pain initially with feeds that eases up and has been supplementing with formula because she states she feels as though she does not have milk. Discussed milk volume/ stomach size. Encouraged hand expression & mom was able to get colostrum easily. Reinforced how she does have milk & her amounts are perfect for baby.  Mom reports no questions at this time. Wrote [facility] number on board & encouraged mom to call at next feeding.  Maternal Data    Feeding Feeding Type: Bottle Fed - Formula (Mother stated she did not attempt feeding -infant was sleepi)  LATCH Score/Interventions                      Lactation Tools Discussed/Used     Consult Status Consult Status: Follow-up Date: 04/24/16 Follow-up type: In-patient    [name] 04/23/2016, 4:25 PM

## 2016-04-24 ENCOUNTER — Encounter (HOSPITAL_COMMUNITY): Payer: Self-pay

## 2016-04-24 LAB — RH IG WORKUP (INCLUDES ABO/RH)
ABO/RH(D): O NEG
Fetal Screen: NEGATIVE
GESTATIONAL AGE(WKS): 41
UNIT DIVISION: 0

## 2016-04-24 MED ORDER — TETANUS-DIPHTH-ACELL PERTUSSIS 5-2.5-18.5 LF-MCG/0.5 IM SUSP
0.5000 mL | Freq: Once | INTRAMUSCULAR | Status: AC
  Administered 2016-04-24: 0.5 mL via INTRAMUSCULAR
  Filled 2016-04-24: qty 0.5

## 2016-04-24 MED ORDER — IBUPROFEN 600 MG PO TABS: 600.0000 mg | ORAL_TABLET | Freq: Four times a day (QID) | ORAL | 0 refills | Status: AC

## 2016-04-24 MED FILL — IBUPROFEN 600 MG TABLET: 600 | 8 days supply | Qty: 30 | Fill #0

## 2016-04-24 NOTE — Progress Notes (Signed)
Pt changed mind and decided on getting tdap vaccine at discharge.

## 2016-04-24 NOTE — Discharge Instructions (Addendum)

## 2016-04-24 NOTE — Discharge Summary (Signed)
Obstetric Discharge Summary Reason for Admission: induction of labor postdate Prenatal Procedures: Bpp 6/8 Intrapartum Procedures: spontaneous vaginal delivery Postpartum Procedures: Rho(D) Ig Complications-Operative and Postpartum: 2nd degree perineal laceration  Delivery Note At  a viable female was delivered via  (presentation ROA: ;  ).  APGAR:9 ,9 ; weight pending  .   Placenta status: complete, .3V  Cord:  with the following complications:variable decel during pregnancy .   Anesthesia:  Epideral, lidocaine Episiotomy:   Lacerations:  2nd degree vaginal Suture Repair: 2.0 vicryl, 4-0 vicryl Est. Blood Loss (mL):  150cc  Mom to postpartum.  Baby to Couplet care / Skin to Skin.  [name] 04/22/2016, 6:09 AM   Hospital Course:  Active Problems:   Post-dates pregnancy   SVD (spontaneous vaginal delivery)   Margaret Richards is a 35 y.o. [id number] s/p SVD.  Patient was admitted 04/21/16.  She has postpartum course that was uncomplicated. The pt feels ready to go home and  will be discharged with outpatient follow-up.   Today: No acute events overnight.  Pt denies problems with ambulating, voiding or po intake.  She denies nausea or vomiting.  Pain is well controlled.  She has had flatus. She has had bowel movement.  Lochia Small.  Plan for birth control is undecided.  Method of Feeding: breast  Physical Exam:  BP (!) 92/59 (BP Location: Right Arm)   Pulse 68   Temp 98 F (36.7 C)   Resp 18   Ht 5\' 4"  (1.626 m)   Wt 215 lb (97.5 kg)   SpO2 94%   Breastfeeding? Unknown   BMI 36.90 kg/m  General: alert and cooperative Lochia: appropriate Uterine Fundus: firm Incision:  DVT Evaluation: No evidence of DVT seen on physical exam.  H/H: Lab Results  Component Value Date/Time   HGB 10.4 (L) 04/23/2016 05:17 AM   HCT 30.1 (L) 04/23/2016 05:17 AM    Discharge Diagnoses: Term Pregnancy-delivered  Discharge Information: Date: 04/24/2016 Activity:  pelvic rest Diet: routine  Medications: Ibuprofen Breast feeding:  Yes Condition: stable Instructions: refer to handout Discharge to: home      Medication List    TAKE these medications   ibuprofen 600 MG tablet Commonly known as:  ADVIL,MOTRIN Take 1 tablet (600 mg total) by mouth every 6 (six) hours.   prenatal multivitamin Tabs tablet Take 1 tablet by mouth at bedtime.      Follow-up Information    COLE,TARA J., MD Follow up in 6 week(s).   Specialty:  Obstetrics and Gynecology Contact information: [id number] [place] [address] [phone]            Margaret Richards, CNM 04/24/2016,9:33 AM

## 2016-04-24 NOTE — Lactation Note (Signed)
This note was copied from a baby's chart. Lactation Consultation Note: Mother states that breastfeeding is going well. Mother breastfeed her other two children. She states she feels changes in her breast and knows milk is coming in. Mother advised to massage and ice breast to prevent servere engorgement . I also  Reviewed S/S of Mastitis . Mother is aware of available [place] services and community support.   Patient Name: Girl Takasha Umholtz Today's Date: 04/24/2016     Maternal Data    Feeding Feeding Type: Breast Fed Length of feed: 20 min  LATCH Score/Interventions Latch: Repeated attempts needed to sustain latch, nipple held in mouth throughout feeding, stimulation needed to elicit sucking reflex. Intervention(s): Skin to skin;Teach feeding cues;Waking techniques Intervention(s): Adjust position;Assist with latch;Breast massage;Breast compression  Audible Swallowing: A few with stimulation Intervention(s): Skin to skin;Hand expression;Alternate breast massage  Type of Nipple: Everted at rest and after stimulation  Comfort (Breast/Nipple): Soft / non-tender  Problem noted: Mild/Moderate discomfort Interventions (Mild/moderate discomfort): Reverse pressue (coconut oil)  Hold (Positioning): Assistance needed to correctly position infant at breast and maintain latch. Intervention(s): Support Pillows;Breastfeeding basics reviewed;Position options;Skin to skin  LATCH Score: 7  Lactation Tools Discussed/Used     Consult Status      Jess Barters [facility] 04/24/2016, 11:02 AM

## 2016-06-03 IMAGING — US US OB TRANSVAGINAL
1 series · 15 of 28 positions shown · non-contrast
Comparison: None.

CLINICAL DATA: Initial encounter for positive pregnancy test with
vaginal bleeding.

EXAM:
OBSTETRIC <14 WK US AND TRANSVAGINAL OB US
TECHNIQUE: Both transabdominal and transvaginal ultrasound examinations were
performed for complete evaluation of the gestation as well as the
maternal uterus, adnexal regions, and pelvic cul-de-sac.
Transvaginal technique was performed to assess early pregnancy.

[Series 1: us ob transvaginal · 15 of 37 slices shown]
[im 1/37]
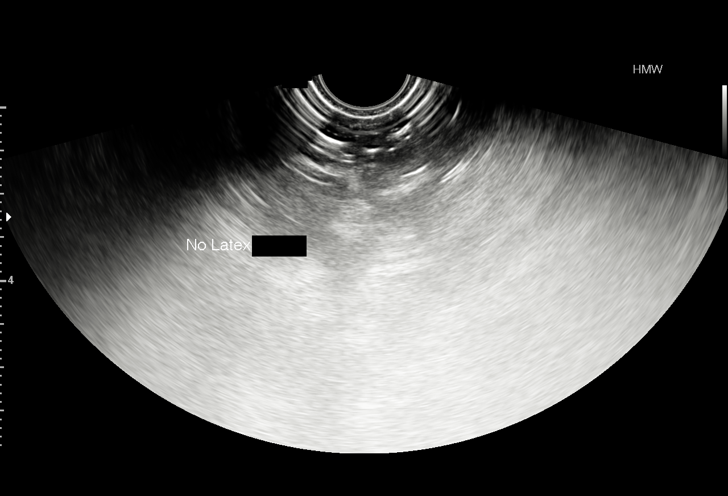
[im 3/37]
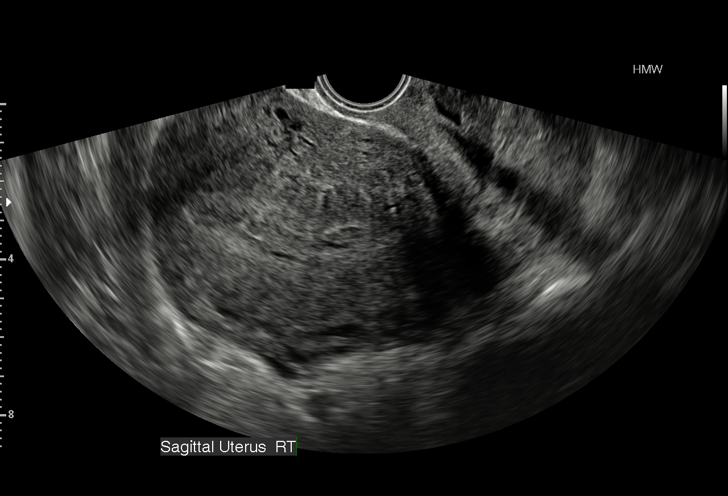
[im 6/37]
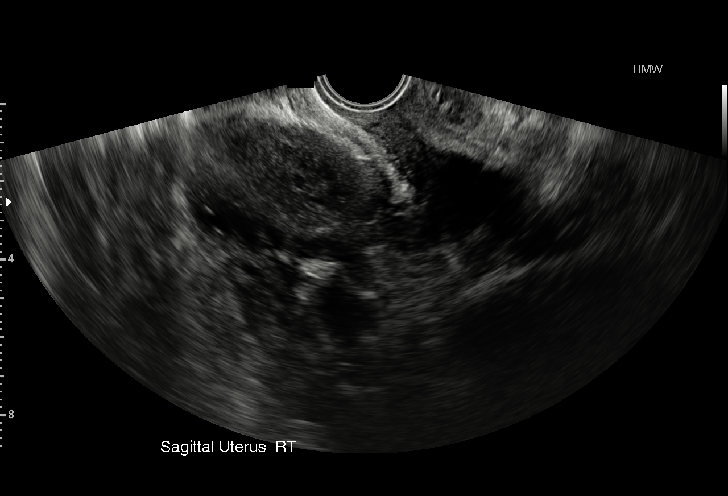
[im 9/37]
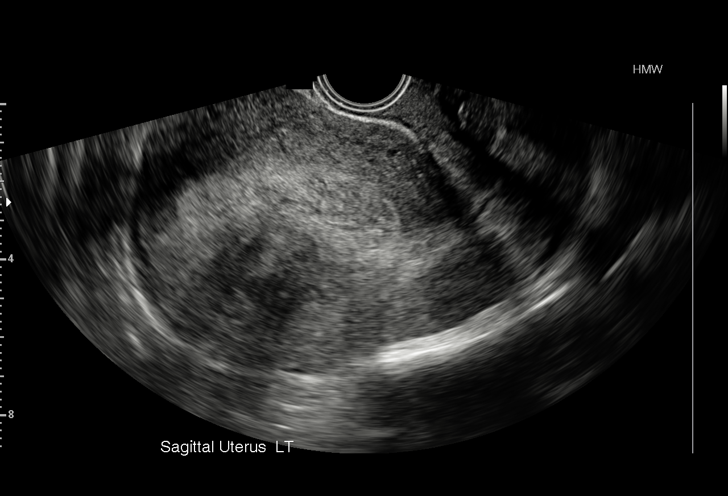
[im 11/37]
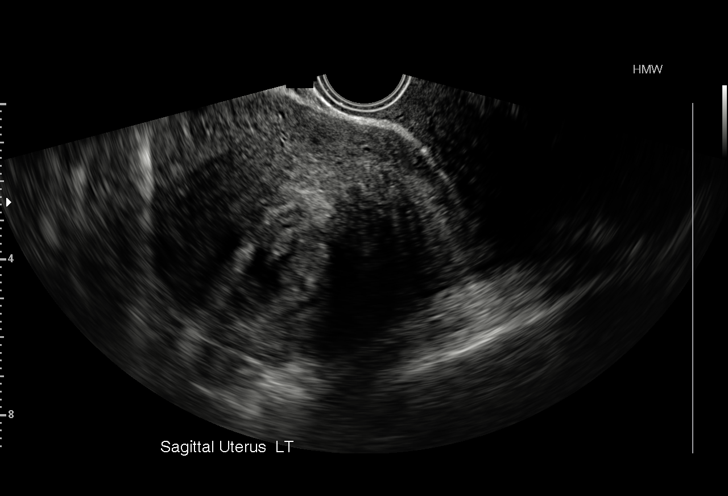
[im 14/37]
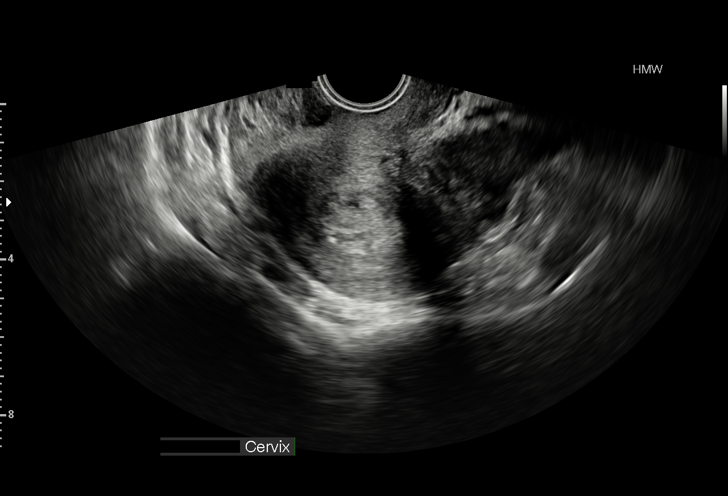
[im 17/37]
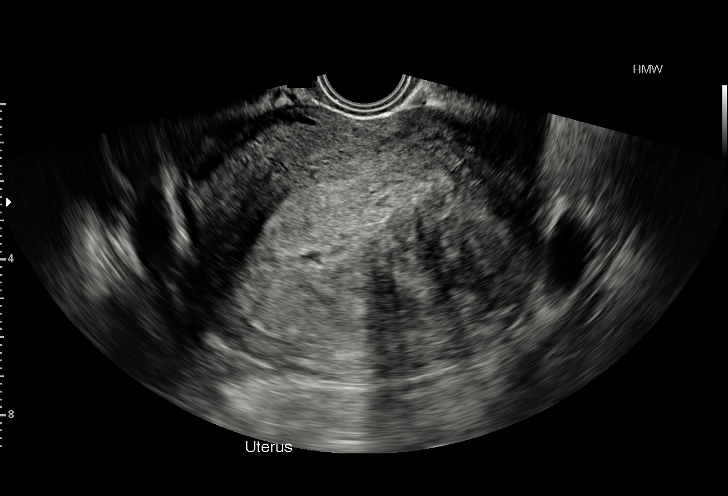
[im 19/37]
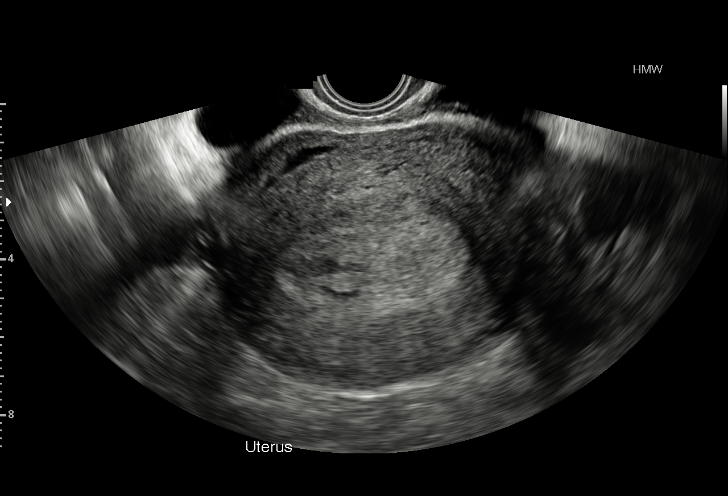
[im 21/37]
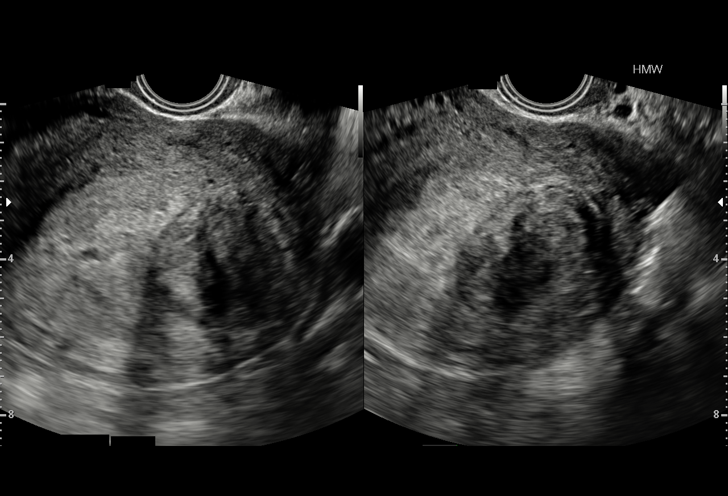
[im 23/37]
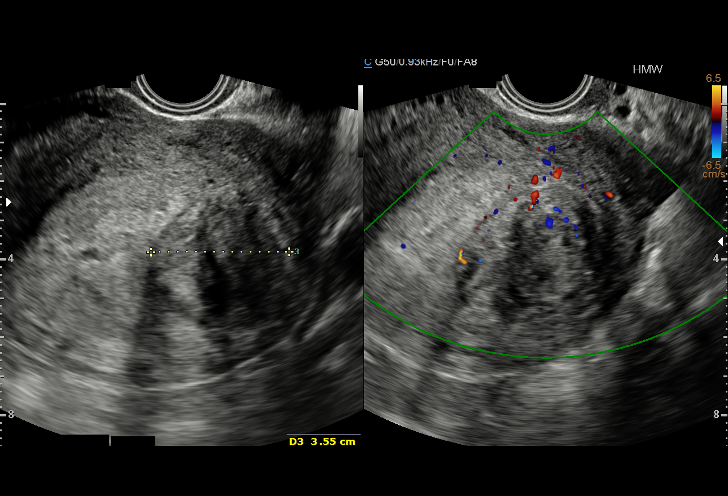
[im 26/37]
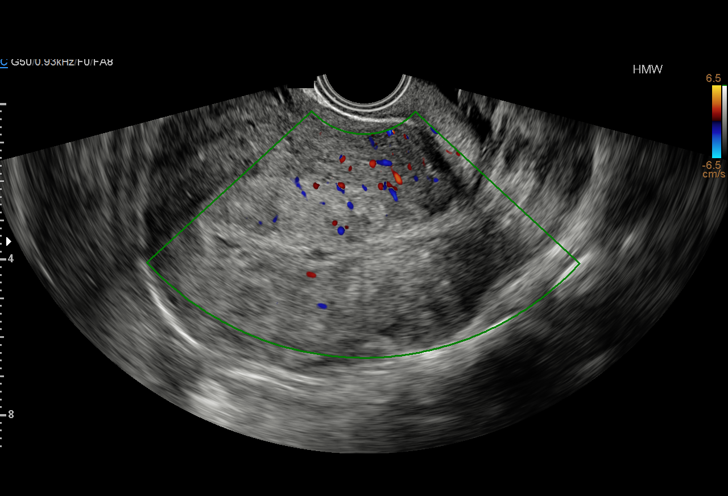
[im 29/37]
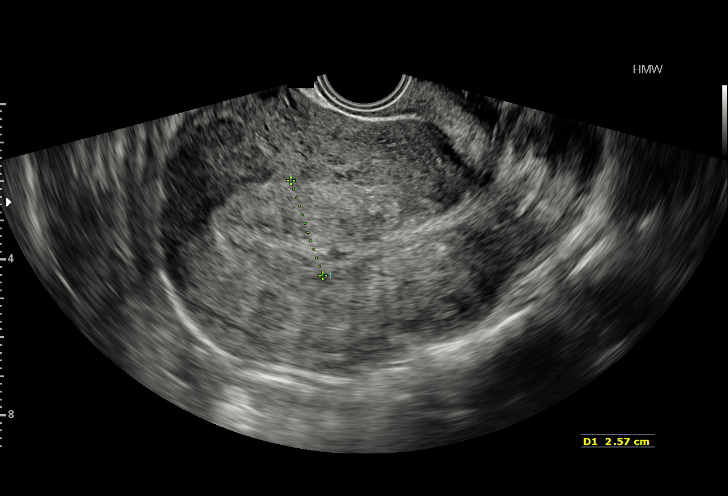
[im 31/37]
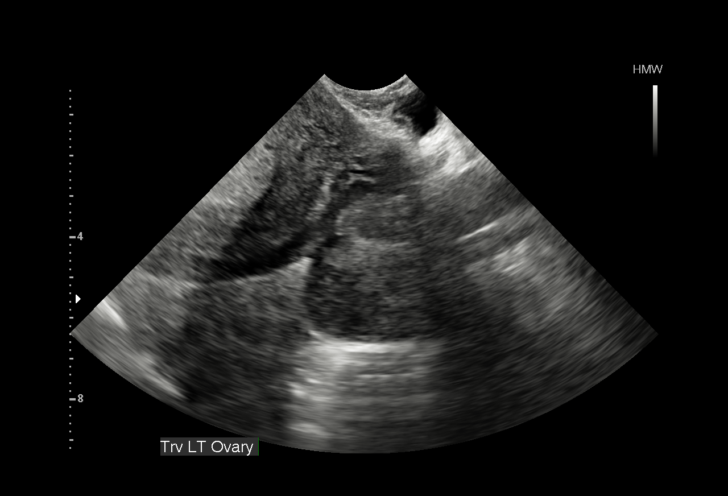
[im 34/37]
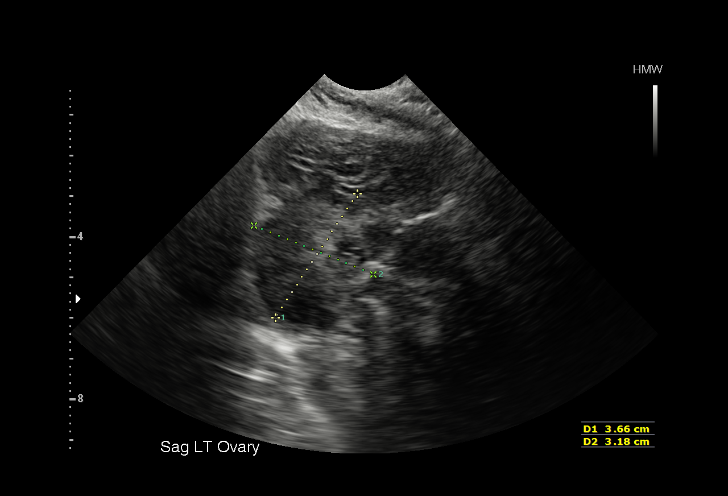
[im 37/37]
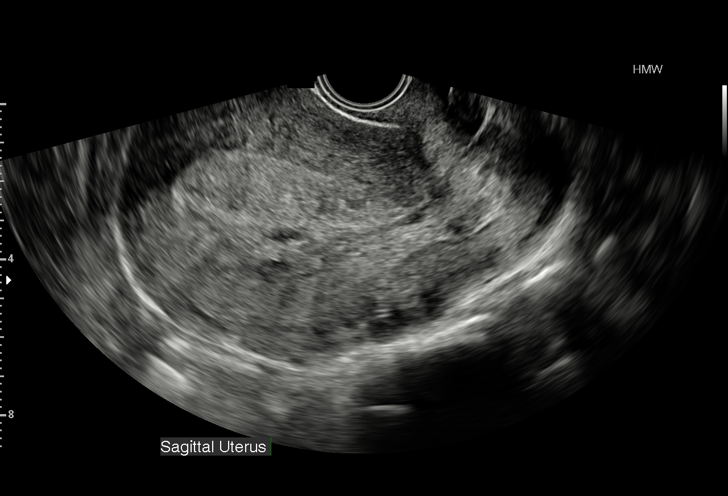

[15 of 28 positions shown; findings below may reference images not displayed]

FINDINGS: Of note, transabdominal scanning was not performed as part of this
exam. As such, detection of lesions high in the anatomic pelvis is
limited.

Intrauterine gestational sac: Nonvisualized.

Maternal uterus/adnexae: Endometrium appears thickened and
heterogeneous. There is no intrauterine gestational sac. 3.6 x 4.2 x
2.7 cm left intramural to submucosal fibroid is evident.

Maternal right ovary not visualized. Maternal left ovary measures
3.7 x 3.7 x 3.2 cm on the endovaginal scanning. No adnexal mass on
either side. No intraperitoneal free fluid.
IMPRESSION: No evidence for intrauterine gestational sac. Given the history of a
positive pregnancy test, differential considerations include
intrauterine gestation too early to visualize, completed abortion,
or nonvisualized ectopic pregnancy. Close clinical correlation is
recommended with serial beta-hCG and followup ultrasound as
warranted.

4 cm left uterine fibroid.

## 2016-06-12 MED FILL — LEVOCETIRIZINE 5 MG TABLET: 5 | 30 days supply | Qty: 30 | Fill #3

## 2016-06-12 MED FILL — MONTELUKAST SOD 10 MG TAB: 10 | 30 days supply | Qty: 30 | Fill #4

## 2016-07-01 DIAGNOSIS — J301 Allergic rhinitis due to pollen: Secondary | ICD-10-CM | POA: Diagnosis not present

## 2016-07-01 DIAGNOSIS — H1045 Other chronic allergic conjunctivitis: Secondary | ICD-10-CM | POA: Diagnosis not present

## 2016-07-01 DIAGNOSIS — J3081 Allergic rhinitis due to animal (cat) (dog) hair and dander: Secondary | ICD-10-CM | POA: Diagnosis not present

## 2016-07-01 DIAGNOSIS — J3089 Other allergic rhinitis: Secondary | ICD-10-CM | POA: Diagnosis not present

## 2016-07-31 MED FILL — MONTELUKAST SOD 10 MG TAB: 10 | 30 days supply | Qty: 30 | Fill #0

## 2016-09-02 ENCOUNTER — Other Ambulatory Visit (HOSPITAL_COMMUNITY)
Admission: RE | Admit: 2016-09-02 | Discharge: 2016-09-02 | Disposition: A | Discharge status: Home / Self Care | Payer: 59 | Source: Ambulatory Visit | Attending: Obstetrics and Gynecology | Admitting: Obstetrics and Gynecology

## 2016-09-02 ENCOUNTER — Other Ambulatory Visit: Payer: Self-pay | Admitting: Obstetrics and Gynecology

## 2016-09-02 DIAGNOSIS — Z1151 Encounter for screening for human papillomavirus (HPV): Secondary | ICD-10-CM | POA: Diagnosis not present

## 2016-09-02 DIAGNOSIS — Z01419 Encounter for gynecological examination (general) (routine) without abnormal findings: Secondary | ICD-10-CM | POA: Insufficient documentation

## 2016-09-07 LAB — CYTOLOGY - PAP
Diagnosis: NEGATIVE
HPV: NOT DETECTED

## 2016-09-28 MED FILL — MONTELUKAST SOD 10 MG TAB: 10 | 30 days supply | Qty: 30 | Fill #1

## 2016-11-05 DIAGNOSIS — E0590 Thyrotoxicosis, unspecified without thyrotoxic crisis or storm: Secondary | ICD-10-CM | POA: Diagnosis not present

## 2016-11-05 DIAGNOSIS — R2230 Localized swelling, mass and lump, unspecified upper limb: Secondary | ICD-10-CM | POA: Diagnosis not present

## 2016-11-05 DIAGNOSIS — J309 Allergic rhinitis, unspecified: Secondary | ICD-10-CM | POA: Diagnosis not present

## 2016-11-05 MED FILL — FLUTICASONE PROP 50 MCG SPR: 50 | 30 days supply | Qty: 16 | Fill #0

## 2016-11-11 MED FILL — MONTELUKAST SOD 10 MG TAB: 10 | 30 days supply | Qty: 30 | Fill #2

## 2016-11-11 MED FILL — LEVOCETIRIZINE 5 MG TABLET: 5 | 30 days supply | Qty: 30 | Fill #0

## 2016-11-26 DIAGNOSIS — J3081 Allergic rhinitis due to animal (cat) (dog) hair and dander: Secondary | ICD-10-CM | POA: Diagnosis not present

## 2016-11-26 DIAGNOSIS — H1045 Other chronic allergic conjunctivitis: Secondary | ICD-10-CM | POA: Diagnosis not present

## 2016-11-26 DIAGNOSIS — J3089 Other allergic rhinitis: Secondary | ICD-10-CM | POA: Diagnosis not present

## 2016-11-26 DIAGNOSIS — J301 Allergic rhinitis due to pollen: Secondary | ICD-10-CM | POA: Diagnosis not present

## 2016-12-07 DIAGNOSIS — L732 Hidradenitis suppurativa: Secondary | ICD-10-CM | POA: Diagnosis not present

## 2016-12-07 MED FILL — CLINDAMYCIN PH 1% SOLUTION: 1 | 30 days supply | Qty: 60 | Fill #0

## 2016-12-07 MED FILL — DOXYCYCLINE HYCLATE 100 MG: 100 | 7 days supply | Qty: 14 | Fill #0

## 2017-01-04 DIAGNOSIS — R21 Rash and other nonspecific skin eruption: Secondary | ICD-10-CM | POA: Diagnosis not present

## 2017-01-04 MED FILL — CLOBETASOL 0.05% CREAM: 0.05 | 30 days supply | Qty: 30 | Fill #0

## 2017-01-26 MED FILL — MONTELUKAST SOD 10 MG TAB: 10 | 30 days supply | Qty: 30 | Fill #3

## 2017-01-26 MED FILL — FLUTICASONE PROP 50 MCG SPR: 50 | 30 days supply | Qty: 16 | Fill #1

## 2017-01-26 MED FILL — LEVOCETIRIZINE 5 MG TABLET: 5 | 30 days supply | Qty: 30 | Fill #1

## 2017-02-23 DIAGNOSIS — R21 Rash and other nonspecific skin eruption: Secondary | ICD-10-CM | POA: Diagnosis not present

## 2017-02-23 DIAGNOSIS — Z1389 Encounter for screening for other disorder: Secondary | ICD-10-CM | POA: Diagnosis not present

## 2017-02-23 MED FILL — DOXYCYCLINE HYCLATE 100 MG: 100 | 14 days supply | Qty: 14 | Fill #0

## 2017-03-10 MED FILL — LEVOCETIRIZINE 5 MG TABLET: 5 | 30 days supply | Qty: 30 | Fill #2

## 2017-04-07 DIAGNOSIS — B353 Tinea pedis: Secondary | ICD-10-CM | POA: Diagnosis not present

## 2017-04-07 DIAGNOSIS — Z79899 Other long term (current) drug therapy: Secondary | ICD-10-CM | POA: Diagnosis not present

## 2017-04-07 MED FILL — KETOCONAZOLE 2% CREAM: 2 | 20 days supply | Qty: 60 | Fill #0

## 2017-04-07 MED FILL — TERBINAFINE HCL 250 MG TABS: 250 | 30 days supply | Qty: 30 | Fill #0

## 2017-04-09 MED FILL — MONTELUKAST SOD 10 MG TAB: 10 | 30 days supply | Qty: 30 | Fill #4

## 2017-05-11 DIAGNOSIS — J309 Allergic rhinitis, unspecified: Secondary | ICD-10-CM | POA: Diagnosis not present

## 2017-05-11 DIAGNOSIS — R21 Rash and other nonspecific skin eruption: Secondary | ICD-10-CM | POA: Diagnosis not present

## 2017-05-11 DIAGNOSIS — Z23 Encounter for immunization: Secondary | ICD-10-CM | POA: Diagnosis not present

## 2017-05-11 DIAGNOSIS — E0590 Thyrotoxicosis, unspecified without thyrotoxic crisis or storm: Secondary | ICD-10-CM | POA: Diagnosis not present

## 2017-07-01 DIAGNOSIS — D721 Eosinophilia: Secondary | ICD-10-CM | POA: Diagnosis not present

## 2017-07-01 DIAGNOSIS — J301 Allergic rhinitis due to pollen: Secondary | ICD-10-CM | POA: Diagnosis not present

## 2017-07-01 DIAGNOSIS — H1045 Other chronic allergic conjunctivitis: Secondary | ICD-10-CM | POA: Diagnosis not present

## 2017-07-01 DIAGNOSIS — J3081 Allergic rhinitis due to animal (cat) (dog) hair and dander: Secondary | ICD-10-CM | POA: Diagnosis not present

## 2017-07-01 DIAGNOSIS — J3089 Other allergic rhinitis: Secondary | ICD-10-CM | POA: Diagnosis not present

## 2017-07-02 DIAGNOSIS — L301 Dyshidrosis [pompholyx]: Secondary | ICD-10-CM | POA: Diagnosis not present

## 2017-07-02 MED FILL — FLUOCINONIDE 0.05% CREAM: 0.05 | 14 days supply | Qty: 60 | Fill #0

## 2017-07-19 DIAGNOSIS — J069 Acute upper respiratory infection, unspecified: Secondary | ICD-10-CM | POA: Diagnosis not present

## 2017-07-19 MED FILL — AMOXICILLIN 500 MG CAPSULE: 500 | 10 days supply | Qty: 30 | Fill #0

## 2017-07-20 ENCOUNTER — Ambulatory Visit: Payer: 59 | Admitting: Family

## 2017-07-21 ENCOUNTER — Ambulatory Visit (INDEPENDENT_AMBULATORY_CARE_PROVIDER_SITE_OTHER): Payer: 59 | Admitting: Infectious Diseases

## 2017-07-21 ENCOUNTER — Encounter: Payer: Self-pay | Admitting: Infectious Diseases

## 2017-07-21 VITALS — BP 111/77 | HR 87 | Temp 98.0°F | Wt 196.0 lb

## 2017-07-21 DIAGNOSIS — B009 Herpesviral infection, unspecified: Secondary | ICD-10-CM | POA: Diagnosis not present

## 2017-07-21 DIAGNOSIS — Z0289 Encounter for other administrative examinations: Secondary | ICD-10-CM | POA: Diagnosis not present

## 2017-07-21 DIAGNOSIS — B789 Strongyloidiasis, unspecified: Secondary | ICD-10-CM | POA: Diagnosis not present

## 2017-07-21 MED ORDER — VALACYCLOVIR HCL 1 G PO TABS: 1000.0000 mg | ORAL_TABLET | Freq: Two times a day (BID) | ORAL | 1 refills | Status: AC

## 2017-07-21 MED ORDER — IVERMECTIN 3 MG PO TABS: 200.0000 ug/kg | ORAL_TABLET | Freq: Every day | ORAL | 0 refills | Status: AC

## 2017-07-21 MED FILL — valACYclovir HCL 1 GM TABS: 1 | 5 days supply | Qty: 10 | Fill #0

## 2017-07-21 MED FILL — IVERMECTIN 3 MG TABLET: 3 | 2 days supply | Qty: 12 | Fill #0

## 2017-07-21 NOTE — Assessment & Plan Note (Addendum)
This dx seems to fit with her hx (social and lab tests).  She also needs to be screened for TB and HIV. She states she was screened prior to her arrival in [place].  She is asx otherwise.  I explained this to her and she then asks if what causes this problem with her WBC.  I again explained this to her (as well as possible complications of this infection) and wrote down the word "strongyloides" on a card for her to also look up.   Will give her rx for ivermectin She is not breast feeding.  Will see her back prn.

## 2017-07-21 NOTE — Assessment & Plan Note (Signed)
Will give her rx for valtrex

## 2017-07-21 NOTE — Progress Notes (Signed)
   Subjective:    Patient ID: Margaret Richards, female    DOB: 09/07/1980, 37 y.o.   MRN: [id number]  HPI 37 yo F, [place] who came to [place] in 2010, who has a hx of "allergies". 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   More recently she has had a "fever and strep throat". She was seen by Dr Deforest Hoyles and started on amoxil.  Feels better now.   The past medical history, family history and social history were reviewed/updated in [facility]   Review of Systems  Constitutional: Positive for chills and fever. Negative for appetite change and unexpected weight change.  HENT: Positive for ear pain, postnasal drip and sore throat. Negative for trouble swallowing.   Respiratory: Negative for cough and shortness of breath.   Gastrointestinal: Negative for constipation and diarrhea.  Genitourinary: Negative for difficulty urinating.  Please see HPI. All other systems reviewed and negative.    Objective:   Physical Exam  Constitutional: She appears well-developed and well-nourished.  HENT:  Mouth/Throat: No oropharyngeal exudate.    Eyes: EOM are normal. Pupils are equal, round, and reactive to light.  Neck: Neck supple.  Cardiovascular: Normal rate, regular rhythm and normal heart sounds.  Pulmonary/Chest: Effort normal and breath sounds normal.  Abdominal: Soft. Bowel sounds are normal. There is no rebound.  Musculoskeletal: She exhibits no edema.  Lymphadenopathy:    She has no cervical adenopathy.  Psychiatric: She has a normal mood and affect.        Assessment & Plan:

## 2017-07-22 LAB — HIV ANTIBODY (ROUTINE TESTING W REFLEX): HIV 1&2 Ab, 4th Generation: NONREACTIVE

## 2017-07-23 LAB — QUANTIFERON-TB GOLD PLUS
Mitogen-NIL: >10 IU/mL
NIL: 0.05 [IU]/mL
QUANTIFERON-TB GOLD PLUS: POSITIVE — AB
TB1-NIL: 0.44 IU/mL
TB2-NIL: 0.51 IU/mL

## 2017-07-27 DIAGNOSIS — M542 Cervicalgia: Secondary | ICD-10-CM | POA: Diagnosis not present

## 2017-07-27 DIAGNOSIS — H9202 Otalgia, left ear: Secondary | ICD-10-CM | POA: Diagnosis not present

## 2017-07-30 ENCOUNTER — Telehealth: Payer: Self-pay | Admitting: *Deleted

## 2017-07-30 NOTE — Telephone Encounter (Signed)
-----   Message from Campbell Riches, MD sent at 07/30/2017 12:44 PM EST ----- Pt needs apt thanks ----- Message ----- From: Cheyenne Adas Lab Results In Sent: 07/23/2017   7:01 AM To: Campbell Riches, MD

## 2017-07-30 NOTE — Telephone Encounter (Signed)
Will call patient.  Does she need an appointment with you? Your next available is 3/13.  Should she be seen sooner? Landis Gandy, RN

## 2017-07-31 NOTE — Telephone Encounter (Signed)
3-13 fine thanks

## 2017-08-02 NOTE — Telephone Encounter (Signed)
She will come 3/11 at 10:00. Thanks!

## 2017-08-09 ENCOUNTER — Ambulatory Visit
Admission: RE | Admit: 2017-08-09 | Discharge: 2017-08-09 | Disposition: A | Discharge status: Home / Self Care | Payer: 59 | Source: Ambulatory Visit | Attending: Infectious Diseases | Admitting: Infectious Diseases

## 2017-08-09 ENCOUNTER — Ambulatory Visit: Payer: 59 | Admitting: Infectious Diseases

## 2017-08-09 ENCOUNTER — Encounter: Payer: Self-pay | Admitting: Infectious Diseases

## 2017-08-09 DIAGNOSIS — B789 Strongyloidiasis, unspecified: Secondary | ICD-10-CM | POA: Diagnosis not present

## 2017-08-09 DIAGNOSIS — A159 Respiratory tuberculosis unspecified: Secondary | ICD-10-CM | POA: Diagnosis not present

## 2017-08-09 DIAGNOSIS — Z227 Latent tuberculosis: Secondary | ICD-10-CM

## 2017-08-09 DIAGNOSIS — R7611 Nonspecific reaction to tuberculin skin test without active tuberculosis: Secondary | ICD-10-CM

## 2017-08-09 LAB — CBC
HCT: 36.5 % (ref 35.0–45.0)
Hemoglobin: 12.3 g/dL (ref 11.7–15.5)
MCH: 30.4 pg (ref 27.0–33.0)
MCHC: 33.7 g/dL (ref 32.0–36.0)
MCV: 90.3 fL (ref 80.0–100.0)
MPV: 10.6 fL (ref 7.5–12.5)
Platelets: 555 10*3/uL — ABNORMAL HIGH (ref 140–400)
RBC: 4.04 10*6/uL (ref 3.80–5.10)
RDW: 13 % (ref 11.0–15.0)
WBC: 5.2 10*3/uL (ref 3.8–10.8)

## 2017-08-09 LAB — COMPREHENSIVE METABOLIC PANEL
AG RATIO: 1.3 (calc) (ref 1.0–2.5)
ALBUMIN MSPROF: 3.9 g/dL (ref 3.6–5.1)
ALT: 7 U/L (ref 6–29)
AST: 15 U/L (ref 10–30)
Alkaline phosphatase (APISO): 60 U/L (ref 33–115)
BILIRUBIN TOTAL: 0.3 mg/dL (ref 0.2–1.2)
BUN / CREAT RATIO: 9 (calc) (ref 6–22)
BUN: 5 mg/dL — ABNORMAL LOW (ref 7–25)
CALCIUM: 9.5 mg/dL (ref 8.6–10.2)
CO2: 29 mmol/L (ref 20–32)
Chloride: 104 mmol/L (ref 98–110)
Creat: 0.58 mg/dL (ref 0.50–1.10)
GLOBULIN: 3.1 g/dL (ref 1.9–3.7)
Glucose, Bld: 77 mg/dL (ref 65–99)
POTASSIUM: 4.5 mmol/L (ref 3.5–5.3)
Sodium: 139 mmol/L (ref 135–146)
Total Protein: 7 g/dL (ref 6.1–8.1)

## 2017-08-09 MED ORDER — RIFAMPIN 300 MG PO CAPS: 600.0000 mg | ORAL_CAPSULE | Freq: Every day | ORAL | 3 refills | Status: AC

## 2017-08-09 MED FILL — rifAMPin 300 MG CAPS: 300 | 30 days supply | Qty: 60 | Fill #0

## 2017-08-09 NOTE — Progress Notes (Signed)
   Subjective:    Patient ID: Margaret Richards, female    DOB: 1981/01/22, 37 y.o.   MRN: [id number]  HPI 37 yo F, [place] who came to [place] in 2010, who has a hx of "allergies". 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  She was seen 2-20 and was given a rx for ivermectin. She took this and had mild rash afterwards.  She also had HIV (-) and quantiferon gold +.  Her last CXR was 04-04-17 (-). She had PPD (-).   Review of Systems  Constitutional: Negative for appetite change, chills, fever and unexpected weight change.  Respiratory: Negative for cough and shortness of breath.   Gastrointestinal: Negative for constipation and diarrhea.  Genitourinary: Negative for difficulty urinating.  Hematological: Negative for adenopathy.       Objective:   Physical Exam  Constitutional: She is oriented to person, place, and time. She appears well-developed and well-nourished.  HENT:  Mouth/Throat: No oropharyngeal exudate.  Eyes: EOM are normal. Pupils are equal, round, and reactive to light.  Neck: Neck supple.  Cardiovascular: Normal rate, regular rhythm and normal heart sounds.  Pulmonary/Chest: Effort normal and breath sounds normal.  Abdominal: Soft. Bowel sounds are normal. There is no tenderness. There is no rebound.  Musculoskeletal: She exhibits no edema.  Lymphadenopathy:    She has no cervical adenopathy.    She has no axillary adenopathy.  Neurological: She is alert and oriented to person, place, and time.  Psychiatric: She has a normal mood and affect.       Assessment & Plan:

## 2017-08-09 NOTE — Assessment & Plan Note (Signed)
She is on no rx's currently.  She needs to have CMP, CBC, and CXR expliained drug sfx to her and her spouse. 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  Will give her 4 months of rifampin.  Will see her abck in 1 month.

## 2017-08-09 NOTE — Assessment & Plan Note (Signed)
Treated, assume resolved.

## 2017-08-24 MED FILL — LEVOCETIRIZINE 5 MG TABLET: 5 | 30 days supply | Qty: 30 | Fill #0

## 2017-08-24 MED FILL — MONTELUKAST SOD 10 MG TAB: 10 | 30 days supply | Qty: 30 | Fill #0

## 2017-09-06 ENCOUNTER — Ambulatory Visit: Payer: 59 | Admitting: Infectious Diseases

## 2017-09-07 DIAGNOSIS — Z01419 Encounter for gynecological examination (general) (routine) without abnormal findings: Secondary | ICD-10-CM | POA: Diagnosis not present

## 2017-09-16 DIAGNOSIS — R21 Rash and other nonspecific skin eruption: Secondary | ICD-10-CM | POA: Diagnosis not present

## 2017-09-16 MED FILL — TRIAMCINOLONE 0.5% OINTMENT: 0.5 | 30 days supply | Qty: 30 | Fill #0

## 2017-10-01 MED FILL — LEVOCETIRIZINE 5 MG TABLET: 5 | 30 days supply | Qty: 30 | Fill #1

## 2017-10-01 MED FILL — MONTELUKAST SOD 10 MG TAB: 10 | 30 days supply | Qty: 30 | Fill #1

## 2017-10-01 MED FILL — rifAMPin 300 MG CAPS: 300 | 30 days supply | Qty: 60 | Fill #1

## 2017-10-05 DIAGNOSIS — L301 Dyshidrosis [pompholyx]: Secondary | ICD-10-CM | POA: Diagnosis not present

## 2017-10-05 DIAGNOSIS — L258 Unspecified contact dermatitis due to other agents: Secondary | ICD-10-CM | POA: Diagnosis not present

## 2017-10-05 MED FILL — BETAMETHASONE DP AUG 0.05%: 0.05 | 25 days supply | Qty: 50 | Fill #0

## 2017-10-27 ENCOUNTER — Ambulatory Visit: Payer: 59 | Admitting: Infectious Diseases

## 2017-11-23 MED FILL — MONTELUKAST SOD 10 MG TAB: 10 | 30 days supply | Qty: 30 | Fill #2

## 2017-11-23 MED FILL — LEVOCETIRIZINE 5 MG TABLET: 5 | 30 days supply | Qty: 30 | Fill #2

## 2018-01-10 DIAGNOSIS — Z6834 Body mass index (BMI) 34.0-34.9, adult: Secondary | ICD-10-CM | POA: Diagnosis not present

## 2018-01-10 DIAGNOSIS — E0590 Thyrotoxicosis, unspecified without thyrotoxic crisis or storm: Secondary | ICD-10-CM | POA: Diagnosis not present

## 2018-01-10 DIAGNOSIS — Z136 Encounter for screening for cardiovascular disorders: Secondary | ICD-10-CM | POA: Diagnosis not present

## 2018-01-10 DIAGNOSIS — Z1389 Encounter for screening for other disorder: Secondary | ICD-10-CM | POA: Diagnosis not present

## 2018-01-10 DIAGNOSIS — Z0000 Encounter for general adult medical examination without abnormal findings: Secondary | ICD-10-CM | POA: Diagnosis not present

## 2018-01-10 DIAGNOSIS — J309 Allergic rhinitis, unspecified: Secondary | ICD-10-CM | POA: Diagnosis not present

## 2018-01-24 MED FILL — LEVOCETIRIZINE 5 MG TABLET: 5 | 30 days supply | Qty: 30 | Fill #3

## 2018-01-24 MED FILL — MONTELUKAST SOD 10 MG TAB: 10 | 30 days supply | Qty: 30 | Fill #3

## 2018-03-08 MED FILL — MONTELUKAST SOD 10 MG TAB: 10 | 30 days supply | Qty: 30 | Fill #4

## 2018-03-08 MED FILL — LEVOCETIRIZINE 5 MG TABLET: 5 | 30 days supply | Qty: 30 | Fill #4

## 2018-05-03 MED FILL — LEVOCETIRIZINE 5 MG TABLET: 5 | 30 days supply | Qty: 30 | Fill #5

## 2018-05-03 MED FILL — FLUTICASONE PROP 50 MCG SPR: 50 | 30 days supply | Qty: 16 | Fill #0

## 2018-05-03 MED FILL — MONTELUKAST SOD 10 MG TAB: 10 | 30 days supply | Qty: 30 | Fill #5

## 2018-06-10 MED FILL — FLUTICASONE PROP 50 MCG SPR: 50 | 30 days supply | Qty: 16 | Fill #0

## 2018-06-30 DIAGNOSIS — J301 Allergic rhinitis due to pollen: Secondary | ICD-10-CM | POA: Diagnosis not present

## 2018-06-30 DIAGNOSIS — J3081 Allergic rhinitis due to animal (cat) (dog) hair and dander: Secondary | ICD-10-CM | POA: Diagnosis not present

## 2018-06-30 DIAGNOSIS — J3089 Other allergic rhinitis: Secondary | ICD-10-CM | POA: Diagnosis not present

## 2018-06-30 DIAGNOSIS — H1045 Other chronic allergic conjunctivitis: Secondary | ICD-10-CM | POA: Diagnosis not present

## 2018-06-30 MED FILL — AZELASTINE HCL 0.05% DROPS: 0.05 | 30 days supply | Qty: 6 | Fill #0

## 2018-06-30 MED FILL — LEVOCETIRIZINE 5 MG TABLET: 5 | 30 days supply | Qty: 30 | Fill #0

## 2018-06-30 MED FILL — MONTELUKAST SOD 10 MG TAB: 10 | 30 days supply | Qty: 30 | Fill #0

## 2018-08-10 MED FILL — MONTELUKAST SOD 10 MG TAB: 10 | 30 days supply | Qty: 30 | Fill #0

## 2018-08-10 MED FILL — LEVOCETIRIZINE 5 MG TABLET: 5 | 30 days supply | Qty: 30 | Fill #0

## 2018-09-16 MED FILL — PAZEO 0.7% EYE DROPS: 0.7 | 30 days supply | Qty: 3 | Fill #0

## 2018-10-15 IMAGING — CR DG CHEST 2V
2 series · 2 of 2 positions shown · non-contrast
Comparison: 04/04/2013

CLINICAL DATA: Latent tuberculosis

EXAM:
CHEST - 2 VIEW

[w chest pa]
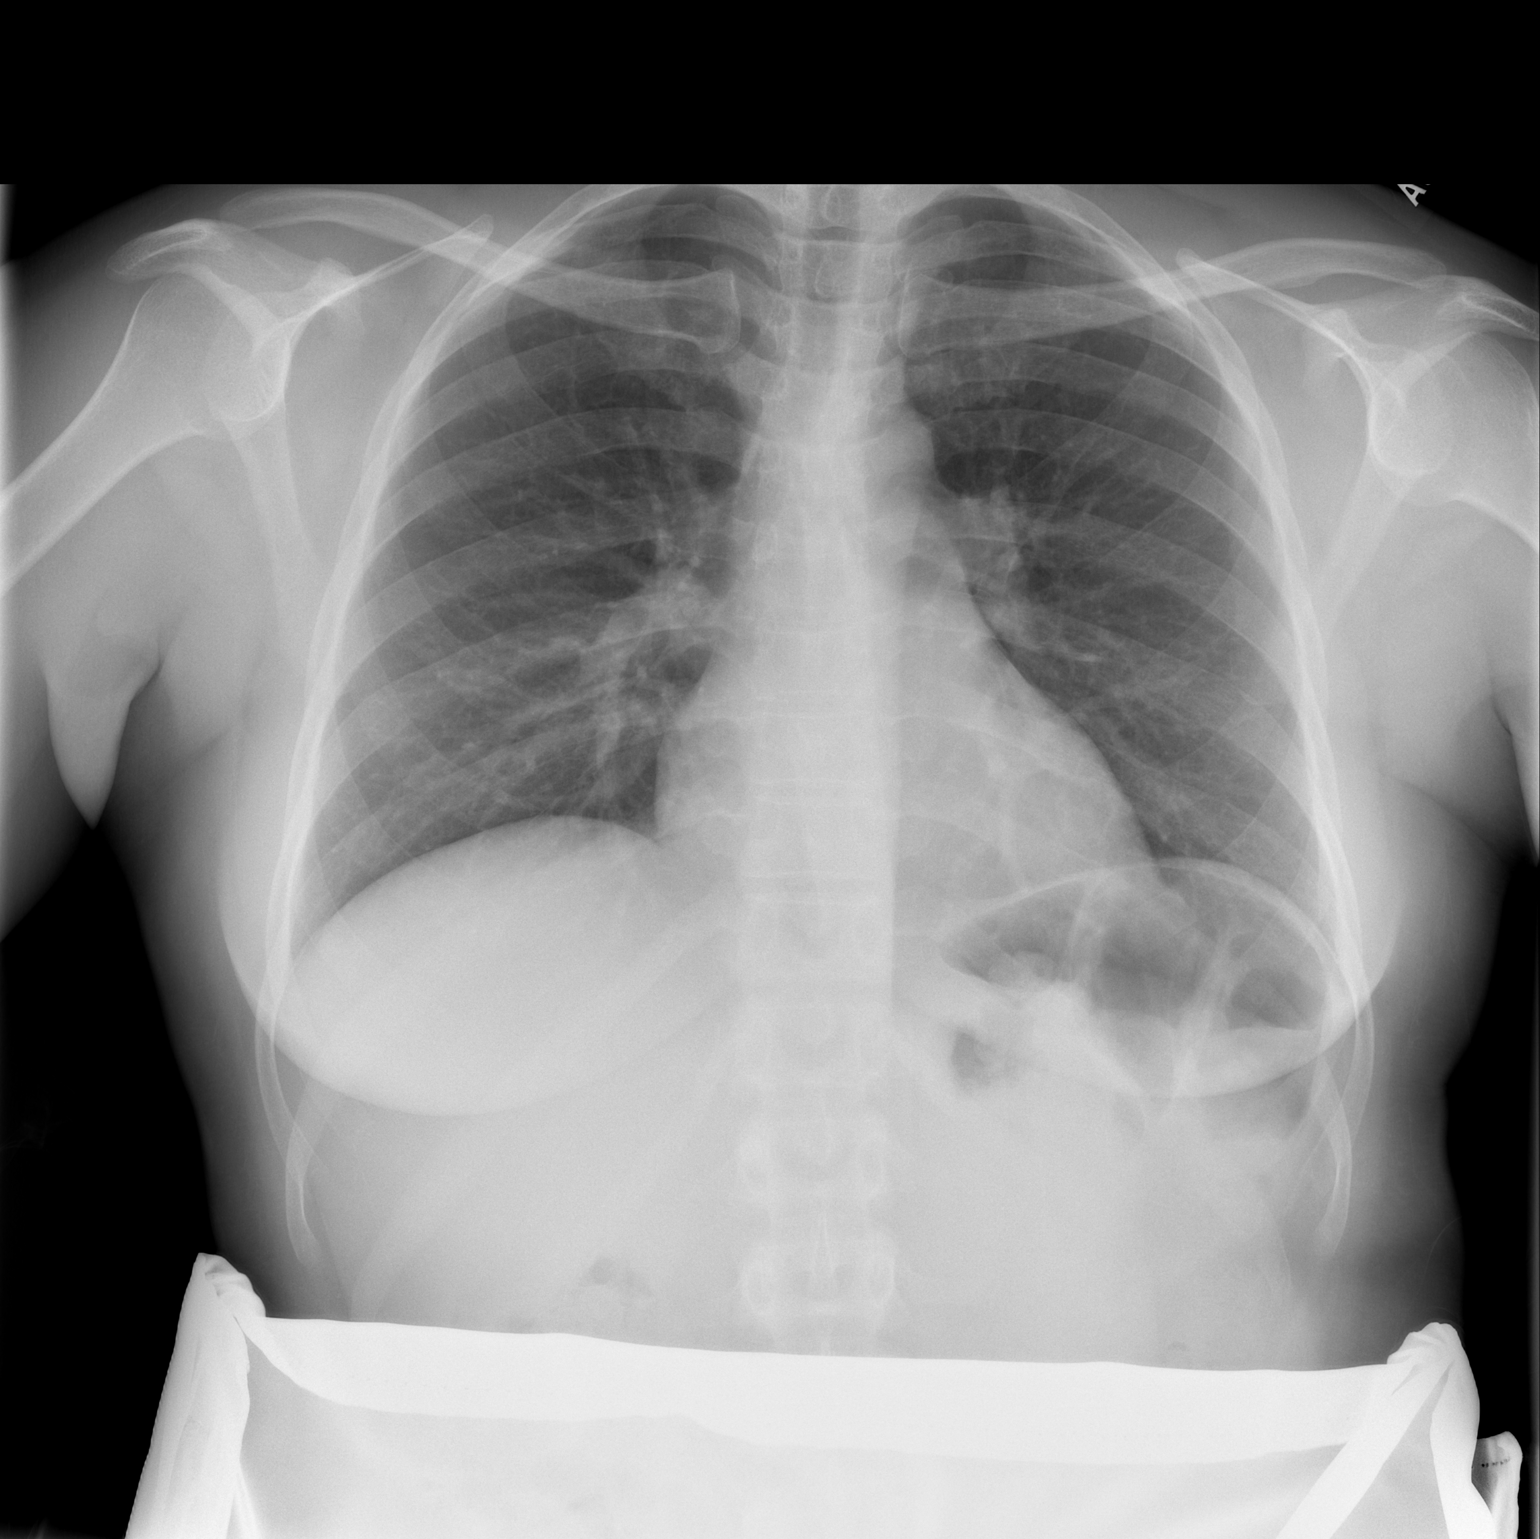

[w chest lat *]
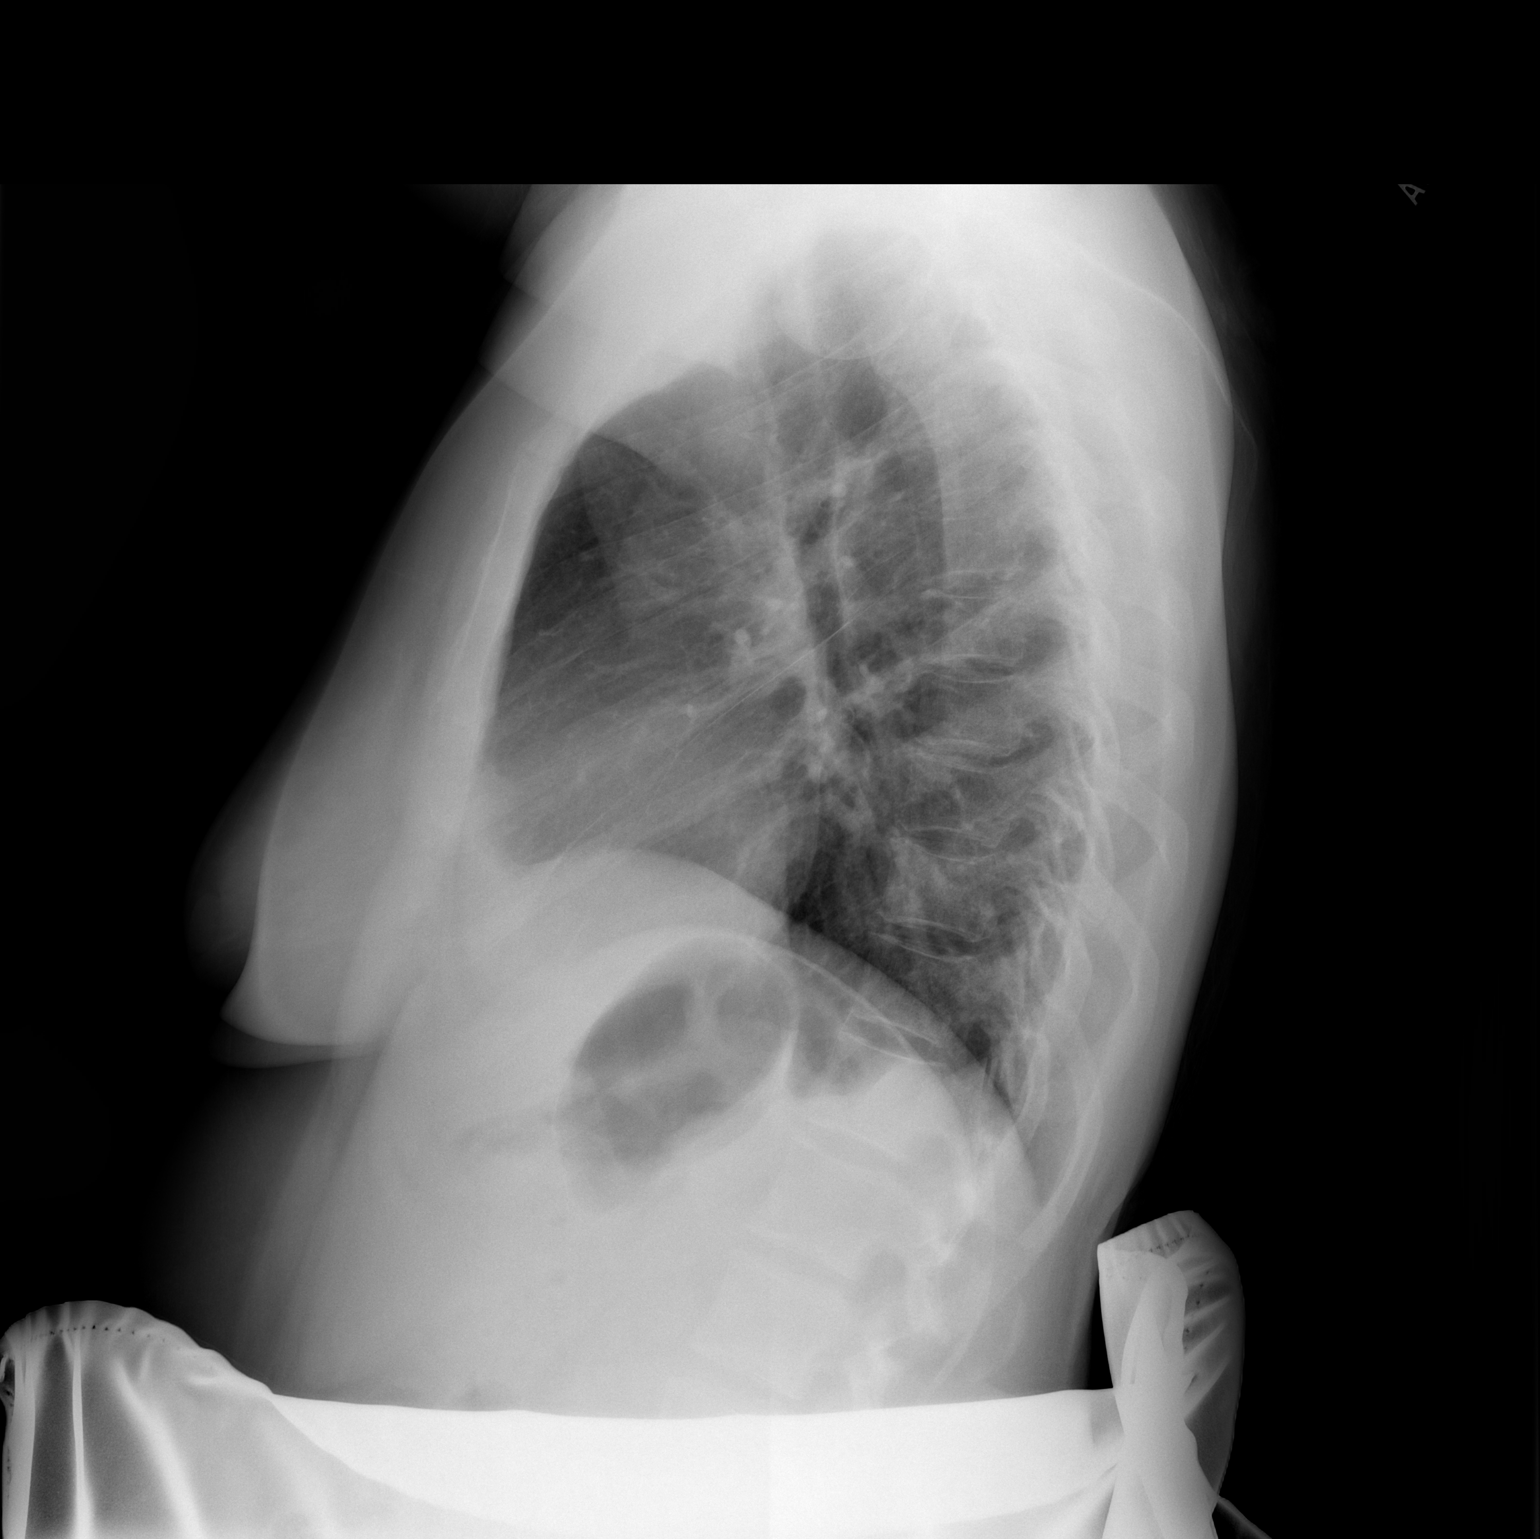

[2 of 2 positions shown; findings below may reference images not displayed]

FINDINGS: Normal heart size and mediastinal contours. No lung opacity,
calcification, or effusion. No osseous findings.
IMPRESSION: Stable and negative chest.

## 2018-11-02 MED FILL — MONTELUKAST SOD 10 MG TAB: 10 | 30 days supply | Qty: 30 | Fill #1

## 2018-11-02 MED FILL — LEVOCETIRIZINE DIHYDROCHLOR: 5 | 30 days supply | Qty: 30 | Fill #1

## 2018-12-20 DIAGNOSIS — Z01419 Encounter for gynecological examination (general) (routine) without abnormal findings: Secondary | ICD-10-CM | POA: Diagnosis not present

## 2019-01-12 DIAGNOSIS — Z13220 Encounter for screening for lipoid disorders: Secondary | ICD-10-CM | POA: Diagnosis not present

## 2019-01-12 DIAGNOSIS — Z1389 Encounter for screening for other disorder: Secondary | ICD-10-CM | POA: Diagnosis not present

## 2019-01-12 DIAGNOSIS — Z0000 Encounter for general adult medical examination without abnormal findings: Secondary | ICD-10-CM | POA: Diagnosis not present

## 2019-01-12 DIAGNOSIS — E0590 Thyrotoxicosis, unspecified without thyrotoxic crisis or storm: Secondary | ICD-10-CM | POA: Diagnosis not present

## 2019-01-12 DIAGNOSIS — J309 Allergic rhinitis, unspecified: Secondary | ICD-10-CM | POA: Diagnosis not present

## 2019-02-07 MED FILL — FLUTICASONE PROP 50 MCG SPR: 50 | 30 days supply | Qty: 16 | Fill #1

## 2019-02-07 MED FILL — MONTELUKAST SOD 10 MG TAB: 10 | 30 days supply | Qty: 30 | Fill #2

## 2019-02-07 MED FILL — LEVOCETIRIZINE 5 MG TABLET: 5 | 30 days supply | Qty: 30 | Fill #2

## 2019-03-13 DIAGNOSIS — D508 Other iron deficiency anemias: Secondary | ICD-10-CM | POA: Diagnosis not present

## 2019-03-15 DIAGNOSIS — Z23 Encounter for immunization: Secondary | ICD-10-CM | POA: Diagnosis not present

## 2019-04-10 ENCOUNTER — Other Ambulatory Visit: Payer: Self-pay

## 2019-04-10 DIAGNOSIS — Z20822 Contact with and (suspected) exposure to covid-19: Secondary | ICD-10-CM

## 2019-04-12 LAB — NOVEL CORONAVIRUS, NAA: SARS-CoV-2, NAA: NOT DETECTED

## 2019-04-12 MED FILL — AZELASTINE HCL 0.05% DROPS: 0.05 | 30 days supply | Qty: 6 | Fill #1

## 2019-04-12 MED FILL — LEVOCETIRIZINE 5 MG TABLET: 5 | 30 days supply | Qty: 30 | Fill #3

## 2019-04-12 MED FILL — MONTELUKAST SOD 10 MG TAB: 10 | 30 days supply | Qty: 30 | Fill #3

## 2019-06-13 MED FILL — MONTELUKAST SOD 10 MG TAB: 10 | 30 days supply | Qty: 30 | Fill #4

## 2019-06-13 MED FILL — FLUTICASONE PROP 50 MCG SPR: 50 | 60 days supply | Qty: 16 | Fill #0

## 2019-06-13 MED FILL — LEVOCETIRIZINE 5 MG TABLET: 5 | 30 days supply | Qty: 30 | Fill #4

## 2019-06-29 DIAGNOSIS — J3081 Allergic rhinitis due to animal (cat) (dog) hair and dander: Secondary | ICD-10-CM | POA: Diagnosis not present

## 2019-06-29 DIAGNOSIS — J3089 Other allergic rhinitis: Secondary | ICD-10-CM | POA: Diagnosis not present

## 2019-06-29 DIAGNOSIS — H1045 Other chronic allergic conjunctivitis: Secondary | ICD-10-CM | POA: Diagnosis not present

## 2019-06-29 DIAGNOSIS — J301 Allergic rhinitis due to pollen: Secondary | ICD-10-CM | POA: Diagnosis not present

## 2019-07-27 MED FILL — MONTELUKAST SOD 10 MG TAB: 10 | 30 days supply | Qty: 30 | Fill #0

## 2019-07-27 MED FILL — AZELASTINE HCL 0.05% DROPS: 0.05 | 30 days supply | Qty: 6 | Fill #0

## 2019-07-27 MED FILL — LEVOCETIRIZINE 5 MG TABLET: 5 | 30 days supply | Qty: 30 | Fill #0

## 2019-12-21 DIAGNOSIS — Z01419 Encounter for gynecological examination (general) (routine) without abnormal findings: Secondary | ICD-10-CM | POA: Diagnosis not present

## 2020-01-15 ENCOUNTER — Other Ambulatory Visit (HOSPITAL_COMMUNITY): Payer: Self-pay | Admitting: Internal Medicine

## 2020-01-15 DIAGNOSIS — Z6835 Body mass index (BMI) 35.0-35.9, adult: Secondary | ICD-10-CM | POA: Diagnosis not present

## 2020-01-15 DIAGNOSIS — Z1389 Encounter for screening for other disorder: Secondary | ICD-10-CM | POA: Diagnosis not present

## 2020-01-15 DIAGNOSIS — J309 Allergic rhinitis, unspecified: Secondary | ICD-10-CM | POA: Diagnosis not present

## 2020-01-15 DIAGNOSIS — Z0000 Encounter for general adult medical examination without abnormal findings: Secondary | ICD-10-CM | POA: Diagnosis not present

## 2020-01-15 DIAGNOSIS — Z13220 Encounter for screening for lipoid disorders: Secondary | ICD-10-CM | POA: Diagnosis not present

## 2020-01-15 MED FILL — LEVOCETIRIZINE 5 MG TABLET: 5 | 30 days supply | Qty: 30 | Fill #2

## 2020-01-15 MED FILL — FLUTICASONE PROP 50 MCG SPR: 50 | 30 days supply | Qty: 16 | Fill #0

## 2020-01-15 MED FILL — MONTELUKAST SOD 10 MG TAB: 10 | 30 days supply | Qty: 30 | Fill #0

## 2020-02-16 MED FILL — LEVOCETIRIZINE 5 MG TABLET: 5 | 30 days supply | Qty: 30 | Fill #0

## 2020-02-16 MED FILL — MONTELUKAST SOD 10 MG TAB: 10 | 30 days supply | Qty: 30 | Fill #0

## 2020-02-19 MED FILL — LEVOCETIRIZINE 5 MG TABLET: 5 | 30 days supply | Qty: 30 | Fill #0

## 2020-02-19 MED FILL — MONTELUKAST SOD 10 MG TAB: 10 | 30 days supply | Qty: 30 | Fill #0

## 2020-02-19 MED FILL — MONTELUKAST SOD 10 MG TAB: 10 | 30 days supply | Qty: 30 | Fill #0 | Status: TO

## 2020-02-19 MED FILL — LEVOCETIRIZINE 5 MG TABLET: 5 | 30 days supply | Qty: 30 | Fill #0 | Status: TO

## 2020-04-03 MED FILL — LEVOCETIRIZINE 5 MG TABLET: 5 | 30 days supply | Qty: 30 | Fill #1

## 2020-04-03 MED FILL — MONTELUKAST SOD 10 MG TAB: 10 | 30 days supply | Qty: 30 | Fill #1

## 2020-05-20 ENCOUNTER — Ambulatory Visit (HOSPITAL_COMMUNITY)
Admission: EM | Admit: 2020-05-20 | Discharge: 2020-05-20 | Disposition: A | Discharge status: Home / Self Care | Payer: 59 | Attending: Internal Medicine | Admitting: Internal Medicine

## 2020-05-20 ENCOUNTER — Other Ambulatory Visit: Payer: Self-pay

## 2020-05-20 DIAGNOSIS — Z20822 Contact with and (suspected) exposure to covid-19: Secondary | ICD-10-CM | POA: Diagnosis not present

## 2020-05-20 LAB — SARS CORONAVIRUS 2 (TAT 6-24 HRS): SARS Coronavirus 2: NEGATIVE

## 2020-05-20 NOTE — ED Triage Notes (Signed)
Pt presents with no sxs. She states she was exposed to [place] and would like to be tested.

## 2020-06-11 ENCOUNTER — Other Ambulatory Visit (HOSPITAL_COMMUNITY): Payer: Self-pay | Admitting: Internal Medicine

## 2020-06-11 DIAGNOSIS — H109 Unspecified conjunctivitis: Secondary | ICD-10-CM | POA: Diagnosis not present

## 2020-06-11 MED FILL — TOBRAMYCIN 0.3 % SOLN: 0.3 | 7 days supply | Qty: 5 | Fill #0

## 2020-06-24 DIAGNOSIS — J3089 Other allergic rhinitis: Secondary | ICD-10-CM | POA: Diagnosis not present

## 2020-06-24 DIAGNOSIS — J301 Allergic rhinitis due to pollen: Secondary | ICD-10-CM | POA: Diagnosis not present

## 2020-06-24 DIAGNOSIS — H1045 Other chronic allergic conjunctivitis: Secondary | ICD-10-CM | POA: Diagnosis not present

## 2020-06-24 DIAGNOSIS — J3081 Allergic rhinitis due to animal (cat) (dog) hair and dander: Secondary | ICD-10-CM | POA: Diagnosis not present

## 2020-07-18 ENCOUNTER — Other Ambulatory Visit (HOSPITAL_COMMUNITY): Payer: Self-pay | Admitting: Allergy and Immunology

## 2020-07-18 MED FILL — LEVOCETIRIZINE 5 MG TABLET: 5 | 30 days supply | Qty: 30 | Fill #0

## 2020-07-18 MED FILL — AZELASTINE HCL 0.05% DROPS: 0.05 | 23 days supply | Qty: 6 | Fill #0

## 2020-07-18 MED FILL — MONTELUKAST SOD 10 MG TAB: 10 | 30 days supply | Qty: 30 | Fill #2

## 2020-07-29 DIAGNOSIS — H40013 Open angle with borderline findings, low risk, bilateral: Secondary | ICD-10-CM | POA: Diagnosis not present

## 2020-07-29 DIAGNOSIS — H1045 Other chronic allergic conjunctivitis: Secondary | ICD-10-CM | POA: Diagnosis not present

## 2020-08-20 MED FILL — MONTELUKAST SOD 10 MG TAB: 10 | 30 days supply | Qty: 30 | Fill #3

## 2020-08-20 MED FILL — LEVOCETIRIZINE 5 MG TABLET: 5 | 30 days supply | Qty: 30 | Fill #1

## 2020-08-20 MED FILL — FLUTICASONE PROP 50 MCG SPR: 50 | 30 days supply | Qty: 16 | Fill #0

## 2020-09-18 ENCOUNTER — Other Ambulatory Visit (HOSPITAL_COMMUNITY): Payer: Self-pay

## 2020-09-18 ENCOUNTER — Other Ambulatory Visit: Payer: Self-pay | Admitting: Internal Medicine

## 2020-09-18 MED ORDER — AZITHROMYCIN 500 MG PO TABS
ORAL_TABLET | ORAL | 0 refills | Status: AC
  Filled 2020-09-18: qty 4, 3d supply, fill #0

## 2020-09-18 MED ORDER — MEFLOQUINE HCL 250 MG PO TABS
250.0000 mg | ORAL_TABLET | ORAL | 0 refills | Status: AC
  Filled 2020-09-18: qty 10, 70d supply, fill #0

## 2020-09-18 MED FILL — Levocetirizine Dihydrochloride Tab 5 MG: ORAL | 30 days supply | Qty: 30 | Fill #0 | Status: AC

## 2020-09-18 MED FILL — Azelastine HCl Ophth Soln 0.05%: OPHTHALMIC | 23 days supply | Qty: 6 | Fill #0 | Status: AC

## 2020-09-18 MED FILL — Montelukast Sodium Tab 10 MG (Base Equiv): ORAL | 30 days supply | Qty: 30 | Fill #0 | Status: AC

## 2020-09-20 ENCOUNTER — Other Ambulatory Visit: Payer: Self-pay | Admitting: Internal Medicine

## 2020-09-20 ENCOUNTER — Other Ambulatory Visit (HOSPITAL_COMMUNITY): Payer: Self-pay

## 2020-09-23 ENCOUNTER — Other Ambulatory Visit (HOSPITAL_COMMUNITY): Payer: Self-pay

## 2020-09-24 ENCOUNTER — Other Ambulatory Visit (HOSPITAL_COMMUNITY): Payer: Self-pay

## 2020-09-24 ENCOUNTER — Other Ambulatory Visit: Payer: Self-pay | Admitting: Internal Medicine

## 2020-09-27 ENCOUNTER — Other Ambulatory Visit (HOSPITAL_COMMUNITY): Payer: Self-pay

## 2020-11-02 DIAGNOSIS — Z20822 Contact with and (suspected) exposure to covid-19: Secondary | ICD-10-CM | POA: Diagnosis not present

## 2020-11-04 ENCOUNTER — Other Ambulatory Visit (HOSPITAL_COMMUNITY): Payer: Self-pay

## 2020-11-04 MED FILL — Azelastine HCl Ophth Soln 0.05%: OPHTHALMIC | 30 days supply | Qty: 6 | Fill #1 | Status: AC

## 2020-11-04 MED FILL — Fluticasone Propionate Nasal Susp 50 MCG/ACT: NASAL | 30 days supply | Qty: 16 | Fill #0 | Status: AC

## 2020-11-04 MED FILL — Montelukast Sodium Tab 10 MG (Base Equiv): ORAL | 90 days supply | Qty: 90 | Fill #1 | Status: AC

## 2020-11-04 MED FILL — Levocetirizine Dihydrochloride Tab 5 MG: ORAL | 90 days supply | Qty: 90 | Fill #1 | Status: AC

## 2020-12-23 DIAGNOSIS — N898 Other specified noninflammatory disorders of vagina: Secondary | ICD-10-CM | POA: Diagnosis not present

## 2020-12-23 DIAGNOSIS — Z01419 Encounter for gynecological examination (general) (routine) without abnormal findings: Secondary | ICD-10-CM | POA: Diagnosis not present

## 2021-01-15 DIAGNOSIS — Z13220 Encounter for screening for lipoid disorders: Secondary | ICD-10-CM | POA: Diagnosis not present

## 2021-01-15 DIAGNOSIS — E559 Vitamin D deficiency, unspecified: Secondary | ICD-10-CM | POA: Diagnosis not present

## 2021-01-15 DIAGNOSIS — J309 Allergic rhinitis, unspecified: Secondary | ICD-10-CM | POA: Diagnosis not present

## 2021-01-15 DIAGNOSIS — Z1389 Encounter for screening for other disorder: Secondary | ICD-10-CM | POA: Diagnosis not present

## 2021-01-15 DIAGNOSIS — Z0000 Encounter for general adult medical examination without abnormal findings: Secondary | ICD-10-CM | POA: Diagnosis not present

## 2021-01-15 DIAGNOSIS — E0590 Thyrotoxicosis, unspecified without thyrotoxic crisis or storm: Secondary | ICD-10-CM | POA: Diagnosis not present

## 2021-01-15 DIAGNOSIS — E7800 Pure hypercholesterolemia, unspecified: Secondary | ICD-10-CM | POA: Diagnosis not present

## 2021-01-17 ENCOUNTER — Other Ambulatory Visit: Payer: Self-pay

## 2021-01-17 ENCOUNTER — Other Ambulatory Visit (HOSPITAL_COMMUNITY): Payer: Self-pay

## 2021-01-20 ENCOUNTER — Other Ambulatory Visit (HOSPITAL_COMMUNITY): Payer: Self-pay

## 2021-01-20 MED ORDER — MONTELUKAST SODIUM 10 MG PO TABS
10.0000 mg | ORAL_TABLET | Freq: Every evening | ORAL | 3 refills | Status: AC
  Filled 2021-01-20: qty 90, 90d supply, fill #0
  Filled 2021-04-02: qty 90, 90d supply, fill #1

## 2021-01-20 MED ORDER — LEVOCETIRIZINE DIHYDROCHLORIDE 5 MG PO TABS
5.0000 mg | ORAL_TABLET | Freq: Every evening | ORAL | 3 refills | Status: AC
  Filled 2021-01-20: qty 30, 30d supply, fill #0
  Filled 2021-04-02: qty 30, 30d supply, fill #1
  Filled 2021-12-16: qty 30, 30d supply, fill #2

## 2021-01-21 ENCOUNTER — Other Ambulatory Visit (HOSPITAL_COMMUNITY): Payer: Self-pay

## 2021-04-02 ENCOUNTER — Other Ambulatory Visit (HOSPITAL_COMMUNITY): Payer: Self-pay

## 2021-05-27 ENCOUNTER — Other Ambulatory Visit (HOSPITAL_COMMUNITY): Payer: Self-pay

## 2021-06-24 ENCOUNTER — Other Ambulatory Visit (HOSPITAL_COMMUNITY): Payer: Self-pay

## 2021-06-24 DIAGNOSIS — J3089 Other allergic rhinitis: Secondary | ICD-10-CM | POA: Diagnosis not present

## 2021-06-24 DIAGNOSIS — H1045 Other chronic allergic conjunctivitis: Secondary | ICD-10-CM | POA: Diagnosis not present

## 2021-06-24 DIAGNOSIS — J3081 Allergic rhinitis due to animal (cat) (dog) hair and dander: Secondary | ICD-10-CM | POA: Diagnosis not present

## 2021-06-24 DIAGNOSIS — J301 Allergic rhinitis due to pollen: Secondary | ICD-10-CM | POA: Diagnosis not present

## 2021-06-24 MED ORDER — LEVOCETIRIZINE DIHYDROCHLORIDE 5 MG PO TABS
5.0000 mg | ORAL_TABLET | Freq: Every evening | ORAL | 5 refills | Status: DC
  Filled 2021-06-24: qty 30, 30d supply, fill #0
  Filled 2021-08-22: qty 30, 30d supply, fill #1
  Filled 2021-10-28: qty 30, 30d supply, fill #2
  Filled 2022-02-12: qty 30, 30d supply, fill #3
  Filled 2022-05-07: qty 30, 30d supply, fill #4

## 2021-06-24 MED ORDER — FLUTICASONE PROPIONATE 50 MCG/ACT NA SUSP
1.0000 | Freq: Every day | NASAL | 5 refills | Status: AC
  Filled 2021-06-24: qty 16, 30d supply, fill #0
  Filled 2021-09-03: qty 16, 60d supply, fill #1

## 2021-06-24 MED ORDER — MONTELUKAST SODIUM 10 MG PO TABS
10.0000 mg | ORAL_TABLET | Freq: Every evening | ORAL | 5 refills | Status: DC
  Filled 2021-06-24: qty 30, 30d supply, fill #0
  Filled 2021-08-22: qty 30, 30d supply, fill #1
  Filled 2021-10-28: qty 30, 30d supply, fill #2
  Filled 2021-12-16: qty 30, 30d supply, fill #3
  Filled 2022-02-12: qty 30, 30d supply, fill #4
  Filled 2022-05-07: qty 30, 30d supply, fill #5

## 2021-06-24 MED ORDER — OLOPATADINE HCL 0.2 % OP SOLN
1.0000 [drp] | Freq: Every day | OPHTHALMIC | 5 refills | Status: AC
  Filled 2021-06-24: qty 2.5, 25d supply, fill #0
  Filled 2021-09-03: qty 2.5, 25d supply, fill #1

## 2021-07-31 DIAGNOSIS — H40013 Open angle with borderline findings, low risk, bilateral: Secondary | ICD-10-CM | POA: Diagnosis not present

## 2021-07-31 DIAGNOSIS — H1045 Other chronic allergic conjunctivitis: Secondary | ICD-10-CM | POA: Diagnosis not present

## 2021-08-22 ENCOUNTER — Other Ambulatory Visit (HOSPITAL_COMMUNITY): Payer: Self-pay

## 2021-09-03 ENCOUNTER — Other Ambulatory Visit (HOSPITAL_COMMUNITY): Payer: Self-pay

## 2021-10-28 ENCOUNTER — Other Ambulatory Visit (HOSPITAL_COMMUNITY): Payer: Self-pay

## 2021-12-16 ENCOUNTER — Other Ambulatory Visit (HOSPITAL_COMMUNITY): Payer: Self-pay

## 2021-12-26 DIAGNOSIS — N898 Other specified noninflammatory disorders of vagina: Secondary | ICD-10-CM | POA: Diagnosis not present

## 2021-12-26 DIAGNOSIS — Z01419 Encounter for gynecological examination (general) (routine) without abnormal findings: Secondary | ICD-10-CM | POA: Diagnosis not present

## 2022-01-09 ENCOUNTER — Other Ambulatory Visit: Payer: Self-pay | Admitting: Obstetrics and Gynecology

## 2022-01-09 DIAGNOSIS — Z1231 Encounter for screening mammogram for malignant neoplasm of breast: Secondary | ICD-10-CM

## 2022-01-27 DIAGNOSIS — E7800 Pure hypercholesterolemia, unspecified: Secondary | ICD-10-CM | POA: Diagnosis not present

## 2022-01-27 DIAGNOSIS — E559 Vitamin D deficiency, unspecified: Secondary | ICD-10-CM | POA: Diagnosis not present

## 2022-01-27 DIAGNOSIS — Z1389 Encounter for screening for other disorder: Secondary | ICD-10-CM | POA: Diagnosis not present

## 2022-01-27 DIAGNOSIS — J309 Allergic rhinitis, unspecified: Secondary | ICD-10-CM | POA: Diagnosis not present

## 2022-01-27 DIAGNOSIS — Z0000 Encounter for general adult medical examination without abnormal findings: Secondary | ICD-10-CM | POA: Diagnosis not present

## 2022-02-05 ENCOUNTER — Ambulatory Visit
Admission: RE | Admit: 2022-02-05 | Discharge: 2022-02-05 | Disposition: A | Discharge status: Home / Self Care | Payer: 59 | Source: Ambulatory Visit | Attending: Obstetrics and Gynecology | Admitting: Obstetrics and Gynecology

## 2022-02-05 DIAGNOSIS — Z1231 Encounter for screening mammogram for malignant neoplasm of breast: Secondary | ICD-10-CM

## 2022-02-12 ENCOUNTER — Other Ambulatory Visit (HOSPITAL_COMMUNITY): Payer: Self-pay

## 2022-02-25 ENCOUNTER — Other Ambulatory Visit (HOSPITAL_COMMUNITY): Payer: Self-pay

## 2022-02-25 DIAGNOSIS — J301 Allergic rhinitis due to pollen: Secondary | ICD-10-CM | POA: Diagnosis not present

## 2022-02-25 DIAGNOSIS — H1045 Other chronic allergic conjunctivitis: Secondary | ICD-10-CM | POA: Diagnosis not present

## 2022-02-25 DIAGNOSIS — J3089 Other allergic rhinitis: Secondary | ICD-10-CM | POA: Diagnosis not present

## 2022-02-25 DIAGNOSIS — J3081 Allergic rhinitis due to animal (cat) (dog) hair and dander: Secondary | ICD-10-CM | POA: Diagnosis not present

## 2022-02-25 MED ORDER — OLOPATADINE HCL 0.2 % OP SOLN
1.0000 [drp] | Freq: Every day | OPHTHALMIC | 5 refills | Status: AC
Start: 2022-02-25 — End: ?
  Filled 2022-02-25: qty 2.5, 25d supply, fill #0

## 2022-03-06 ENCOUNTER — Other Ambulatory Visit (HOSPITAL_COMMUNITY): Payer: Self-pay

## 2022-05-07 ENCOUNTER — Other Ambulatory Visit (HOSPITAL_COMMUNITY): Payer: Self-pay

## 2022-06-23 DIAGNOSIS — H40013 Open angle with borderline findings, low risk, bilateral: Secondary | ICD-10-CM | POA: Diagnosis not present

## 2022-06-23 DIAGNOSIS — H1045 Other chronic allergic conjunctivitis: Secondary | ICD-10-CM | POA: Diagnosis not present

## 2022-07-16 ENCOUNTER — Other Ambulatory Visit (HOSPITAL_COMMUNITY): Payer: Self-pay

## 2022-07-16 MED ORDER — LEVOCETIRIZINE DIHYDROCHLORIDE 5 MG PO TABS
5.0000 mg | ORAL_TABLET | Freq: Every evening | ORAL | 5 refills | Status: DC
  Filled 2022-07-16: qty 30, 30d supply, fill #0
  Filled 2022-09-22 (×2): qty 30, 30d supply, fill #1
  Filled 2022-11-06: qty 30, 30d supply, fill #2
  Filled 2022-12-07: qty 30, 30d supply, fill #3
  Filled 2023-02-04 (×2): qty 30, 30d supply, fill #4
  Filled 2023-04-08: qty 30, 30d supply, fill #5

## 2022-07-16 MED ORDER — MONTELUKAST SODIUM 10 MG PO TABS
10.0000 mg | ORAL_TABLET | Freq: Every evening | ORAL | 5 refills | Status: AC
  Filled 2022-07-16: qty 30, 30d supply, fill #0
  Filled 2022-09-22 (×2): qty 30, 30d supply, fill #1
  Filled 2022-11-06: qty 30, 30d supply, fill #2
  Filled 2022-12-07: qty 30, 30d supply, fill #3
  Filled 2023-02-04 (×2): qty 30, 30d supply, fill #4
  Filled 2023-06-21: qty 30, 30d supply, fill #5

## 2022-07-17 ENCOUNTER — Other Ambulatory Visit (HOSPITAL_COMMUNITY): Payer: Self-pay

## 2022-09-22 ENCOUNTER — Other Ambulatory Visit (HOSPITAL_COMMUNITY): Payer: Self-pay

## 2022-11-06 ENCOUNTER — Other Ambulatory Visit (HOSPITAL_COMMUNITY): Payer: Self-pay

## 2022-12-07 ENCOUNTER — Other Ambulatory Visit (HOSPITAL_COMMUNITY): Payer: Self-pay

## 2022-12-30 ENCOUNTER — Other Ambulatory Visit: Payer: Self-pay | Admitting: Obstetrics and Gynecology

## 2022-12-30 ENCOUNTER — Other Ambulatory Visit (HOSPITAL_COMMUNITY): Admission: RE | Admit: 2022-12-30 | Payer: Commercial Managed Care - PPO | Source: Ambulatory Visit

## 2022-12-30 DIAGNOSIS — N898 Other specified noninflammatory disorders of vagina: Secondary | ICD-10-CM | POA: Diagnosis not present

## 2022-12-30 DIAGNOSIS — Z01419 Encounter for gynecological examination (general) (routine) without abnormal findings: Secondary | ICD-10-CM | POA: Diagnosis not present

## 2023-01-01 LAB — CYTOLOGY - PAP
Comment: NEGATIVE
Diagnosis: NEGATIVE
High risk HPV: NEGATIVE

## 2023-02-04 ENCOUNTER — Other Ambulatory Visit (HOSPITAL_COMMUNITY): Payer: Self-pay

## 2023-02-15 ENCOUNTER — Other Ambulatory Visit: Payer: Self-pay | Admitting: Obstetrics and Gynecology

## 2023-02-15 DIAGNOSIS — Z1231 Encounter for screening mammogram for malignant neoplasm of breast: Secondary | ICD-10-CM

## 2023-02-18 DIAGNOSIS — E7800 Pure hypercholesterolemia, unspecified: Secondary | ICD-10-CM | POA: Diagnosis not present

## 2023-02-18 DIAGNOSIS — E559 Vitamin D deficiency, unspecified: Secondary | ICD-10-CM | POA: Diagnosis not present

## 2023-02-18 DIAGNOSIS — E669 Obesity, unspecified: Secondary | ICD-10-CM | POA: Diagnosis not present

## 2023-02-18 DIAGNOSIS — Z23 Encounter for immunization: Secondary | ICD-10-CM | POA: Diagnosis not present

## 2023-02-18 DIAGNOSIS — D508 Other iron deficiency anemias: Secondary | ICD-10-CM | POA: Diagnosis not present

## 2023-02-18 DIAGNOSIS — J309 Allergic rhinitis, unspecified: Secondary | ICD-10-CM | POA: Diagnosis not present

## 2023-02-18 DIAGNOSIS — Z13220 Encounter for screening for lipoid disorders: Secondary | ICD-10-CM | POA: Diagnosis not present

## 2023-02-18 DIAGNOSIS — Z0000 Encounter for general adult medical examination without abnormal findings: Secondary | ICD-10-CM | POA: Diagnosis not present

## 2023-02-18 DIAGNOSIS — Z1389 Encounter for screening for other disorder: Secondary | ICD-10-CM | POA: Diagnosis not present

## 2023-02-22 ENCOUNTER — Other Ambulatory Visit (HOSPITAL_COMMUNITY): Payer: Self-pay

## 2023-02-23 ENCOUNTER — Other Ambulatory Visit (HOSPITAL_COMMUNITY): Payer: Self-pay

## 2023-02-23 MED ORDER — ZEPBOUND 2.5 MG/0.5ML ~~LOC~~ SOAJ
2.5000 mg | SUBCUTANEOUS | 3 refills | Status: AC
  Filled 2023-02-23: qty 2, 28d supply, fill #0

## 2023-03-04 ENCOUNTER — Other Ambulatory Visit (HOSPITAL_COMMUNITY): Payer: Self-pay

## 2023-03-04 ENCOUNTER — Ambulatory Visit
Admission: RE | Admit: 2023-03-04 | Discharge: 2023-03-04 | Disposition: A | Discharge status: Home / Self Care | Payer: Commercial Managed Care - PPO | Source: Ambulatory Visit | Attending: Obstetrics and Gynecology | Admitting: Obstetrics and Gynecology

## 2023-03-04 DIAGNOSIS — Z1231 Encounter for screening mammogram for malignant neoplasm of breast: Secondary | ICD-10-CM

## 2023-03-04 DIAGNOSIS — J301 Allergic rhinitis due to pollen: Secondary | ICD-10-CM | POA: Diagnosis not present

## 2023-03-04 DIAGNOSIS — J3081 Allergic rhinitis due to animal (cat) (dog) hair and dander: Secondary | ICD-10-CM | POA: Diagnosis not present

## 2023-03-04 DIAGNOSIS — H1045 Other chronic allergic conjunctivitis: Secondary | ICD-10-CM | POA: Diagnosis not present

## 2023-03-04 DIAGNOSIS — J3089 Other allergic rhinitis: Secondary | ICD-10-CM | POA: Diagnosis not present

## 2023-03-04 MED ORDER — OLOPATADINE HCL 0.2 % OP SOLN
1.0000 [drp] | Freq: Every day | OPHTHALMIC | 5 refills | Status: AC
  Filled 2023-03-04 (×2): qty 2.5, 50d supply, fill #0
  Filled 2023-05-20: qty 2.5, 50d supply, fill #1

## 2023-03-04 MED ORDER — DESLORATADINE 5 MG PO TABS
5.0000 mg | ORAL_TABLET | Freq: Every day | ORAL | 5 refills | Status: AC
  Filled 2023-03-04: qty 30, 30d supply, fill #0
  Filled 2023-04-08: qty 30, 30d supply, fill #1
  Filled 2023-05-20: qty 30, 30d supply, fill #2
  Filled 2023-06-21: qty 30, 30d supply, fill #3
  Filled 2023-08-17 – 2023-09-28 (×2): qty 30, 30d supply, fill #4
  Filled 2023-11-02: qty 30, 30d supply, fill #5

## 2023-03-04 MED ORDER — MONTELUKAST SODIUM 10 MG PO TABS
10.0000 mg | ORAL_TABLET | Freq: Every evening | ORAL | 5 refills | Status: AC
  Filled 2023-03-04: qty 30, 30d supply, fill #0
  Filled 2023-04-08: qty 30, 30d supply, fill #1
  Filled 2023-05-20: qty 30, 30d supply, fill #2
  Filled 2023-08-17: qty 30, 30d supply, fill #3
  Filled 2023-09-28: qty 30, 30d supply, fill #4
  Filled 2023-11-02: qty 30, 30d supply, fill #5

## 2023-03-04 MED ORDER — FLUTICASONE PROPIONATE 50 MCG/ACT NA SUSP
1.0000 | Freq: Every day | NASAL | 5 refills | Status: AC
  Filled 2023-03-04: qty 16, 60d supply, fill #0
  Filled 2023-05-20: qty 16, 60d supply, fill #1

## 2023-03-23 DIAGNOSIS — E669 Obesity, unspecified: Secondary | ICD-10-CM | POA: Diagnosis not present

## 2023-03-30 DIAGNOSIS — E669 Obesity, unspecified: Secondary | ICD-10-CM | POA: Diagnosis not present

## 2023-04-08 ENCOUNTER — Other Ambulatory Visit (HOSPITAL_COMMUNITY): Payer: Self-pay

## 2023-04-09 ENCOUNTER — Other Ambulatory Visit (HOSPITAL_COMMUNITY): Payer: Self-pay

## 2023-05-20 ENCOUNTER — Other Ambulatory Visit (HOSPITAL_COMMUNITY): Payer: Self-pay

## 2023-05-20 ENCOUNTER — Other Ambulatory Visit: Payer: Self-pay

## 2023-05-20 MED ORDER — LEVOCETIRIZINE DIHYDROCHLORIDE 5 MG PO TABS
5.0000 mg | ORAL_TABLET | Freq: Every evening | ORAL | 5 refills | Status: AC
  Filled 2023-05-20: qty 30, 30d supply, fill #0
  Filled 2023-06-21: qty 30, 30d supply, fill #1
  Filled 2023-08-17: qty 30, 30d supply, fill #2
  Filled 2023-09-28: qty 30, 30d supply, fill #3
  Filled 2023-11-02: qty 30, 30d supply, fill #4

## 2023-06-21 ENCOUNTER — Other Ambulatory Visit (HOSPITAL_COMMUNITY): Payer: Self-pay

## 2023-06-22 ENCOUNTER — Other Ambulatory Visit (HOSPITAL_COMMUNITY): Payer: Self-pay

## 2023-08-10 DIAGNOSIS — H1045 Other chronic allergic conjunctivitis: Secondary | ICD-10-CM | POA: Diagnosis not present

## 2023-08-10 DIAGNOSIS — H40013 Open angle with borderline findings, low risk, bilateral: Secondary | ICD-10-CM | POA: Diagnosis not present

## 2023-08-17 ENCOUNTER — Other Ambulatory Visit (HOSPITAL_COMMUNITY): Payer: Self-pay

## 2023-08-23 DIAGNOSIS — E7800 Pure hypercholesterolemia, unspecified: Secondary | ICD-10-CM | POA: Diagnosis not present

## 2023-08-27 ENCOUNTER — Other Ambulatory Visit (HOSPITAL_COMMUNITY): Payer: Self-pay

## 2023-08-27 MED ORDER — ROSUVASTATIN CALCIUM 10 MG PO TABS
10.0000 mg | ORAL_TABLET | Freq: Every day | ORAL | 3 refills | Status: AC
  Filled 2023-08-27: qty 90, 90d supply, fill #0

## 2023-09-08 ENCOUNTER — Other Ambulatory Visit (HOSPITAL_COMMUNITY): Payer: Self-pay

## 2023-09-28 ENCOUNTER — Other Ambulatory Visit (HOSPITAL_COMMUNITY): Payer: Self-pay

## 2023-10-29 DIAGNOSIS — E7800 Pure hypercholesterolemia, unspecified: Secondary | ICD-10-CM | POA: Diagnosis not present

## 2023-11-02 ENCOUNTER — Other Ambulatory Visit (HOSPITAL_COMMUNITY): Payer: Self-pay

## 2023-11-10 ENCOUNTER — Other Ambulatory Visit (HOSPITAL_COMMUNITY): Payer: Self-pay

## 2023-11-10 MED ORDER — FLUTICASONE PROPIONATE 50 MCG/ACT NA SUSP
1.0000 | Freq: Every day | NASAL | 5 refills | Status: AC
  Filled 2023-11-10: qty 16, 60d supply, fill #0

## 2023-11-10 MED ORDER — MONTELUKAST SODIUM 10 MG PO TABS
10.0000 mg | ORAL_TABLET | Freq: Every evening | ORAL | 5 refills | Status: AC
  Filled 2023-11-10: qty 30, 30d supply, fill #0

## 2023-11-10 MED ORDER — LEVOCETIRIZINE DIHYDROCHLORIDE 5 MG PO TABS
5.0000 mg | ORAL_TABLET | Freq: Every day | ORAL | 5 refills | Status: AC
  Filled 2023-11-10: qty 30, 30d supply, fill #0

## 2023-11-10 MED ORDER — OLOPATADINE HCL 0.2 % OP SOLN
1.0000 [drp] | Freq: Every day | OPHTHALMIC | 5 refills | Status: AC
  Filled 2023-11-10: qty 2.5, 25d supply, fill #0

## 2023-11-10 MED ORDER — DESLORATADINE 5 MG PO TABS
5.0000 mg | ORAL_TABLET | Freq: Every day | ORAL | 5 refills | Status: AC
  Filled 2023-11-10: qty 30, 30d supply, fill #0

## 2023-11-11 ENCOUNTER — Other Ambulatory Visit (HOSPITAL_COMMUNITY): Payer: Self-pay

## 2023-11-11 MED ORDER — ATOVAQUONE-PROGUANIL HCL 250-100 MG PO TABS
1.0000 | ORAL_TABLET | Freq: Every day | ORAL | 0 refills | Status: AC
  Filled 2023-11-11: qty 60, 60d supply, fill #0

## 2023-11-11 MED ORDER — AZITHROMYCIN 500 MG PO TABS
500.0000 mg | ORAL_TABLET | Freq: Every day | ORAL | 0 refills | Status: AC
  Filled 2023-11-11: qty 4, 4d supply, fill #0

## 2023-11-12 ENCOUNTER — Other Ambulatory Visit (HOSPITAL_COMMUNITY): Payer: Self-pay

## 2023-11-22 ENCOUNTER — Other Ambulatory Visit (HOSPITAL_COMMUNITY): Payer: Self-pay

## 2024-01-10 DIAGNOSIS — N939 Abnormal uterine and vaginal bleeding, unspecified: Secondary | ICD-10-CM | POA: Diagnosis not present

## 2024-01-10 DIAGNOSIS — Z01419 Encounter for gynecological examination (general) (routine) without abnormal findings: Secondary | ICD-10-CM | POA: Diagnosis not present

## 2024-02-08 ENCOUNTER — Other Ambulatory Visit: Payer: Self-pay | Admitting: Obstetrics and Gynecology

## 2024-02-08 ENCOUNTER — Other Ambulatory Visit (HOSPITAL_COMMUNITY): Payer: Self-pay

## 2024-02-08 DIAGNOSIS — Z3202 Encounter for pregnancy test, result negative: Secondary | ICD-10-CM | POA: Diagnosis not present

## 2024-02-08 DIAGNOSIS — N939 Abnormal uterine and vaginal bleeding, unspecified: Secondary | ICD-10-CM | POA: Diagnosis not present

## 2024-02-08 DIAGNOSIS — D250 Submucous leiomyoma of uterus: Secondary | ICD-10-CM | POA: Diagnosis not present

## 2024-02-08 DIAGNOSIS — R9389 Abnormal findings on diagnostic imaging of other specified body structures: Secondary | ICD-10-CM | POA: Diagnosis not present

## 2024-02-08 MED ORDER — NORETHINDRONE ACETATE 5 MG PO TABS
5.0000 mg | ORAL_TABLET | Freq: Every day | ORAL | 0 refills | Status: AC
  Filled 2024-02-08: qty 30, 30d supply, fill #0

## 2024-02-22 DIAGNOSIS — D250 Submucous leiomyoma of uterus: Secondary | ICD-10-CM | POA: Diagnosis not present

## 2024-02-22 DIAGNOSIS — Z3202 Encounter for pregnancy test, result negative: Secondary | ICD-10-CM | POA: Diagnosis not present

## 2024-02-22 DIAGNOSIS — N939 Abnormal uterine and vaginal bleeding, unspecified: Secondary | ICD-10-CM | POA: Diagnosis not present

## 2024-03-01 ENCOUNTER — Other Ambulatory Visit: Payer: Self-pay | Admitting: Obstetrics and Gynecology

## 2024-03-01 DIAGNOSIS — Z1231 Encounter for screening mammogram for malignant neoplasm of breast: Secondary | ICD-10-CM

## 2024-03-07 ENCOUNTER — Other Ambulatory Visit (HOSPITAL_COMMUNITY): Payer: Self-pay

## 2024-03-07 DIAGNOSIS — J309 Allergic rhinitis, unspecified: Secondary | ICD-10-CM | POA: Diagnosis not present

## 2024-03-07 DIAGNOSIS — Z0000 Encounter for general adult medical examination without abnormal findings: Secondary | ICD-10-CM | POA: Diagnosis not present

## 2024-03-07 DIAGNOSIS — D259 Leiomyoma of uterus, unspecified: Secondary | ICD-10-CM | POA: Diagnosis not present

## 2024-03-07 DIAGNOSIS — E0590 Thyrotoxicosis, unspecified without thyrotoxic crisis or storm: Secondary | ICD-10-CM | POA: Diagnosis not present

## 2024-03-07 DIAGNOSIS — D508 Other iron deficiency anemias: Secondary | ICD-10-CM | POA: Diagnosis not present

## 2024-03-07 DIAGNOSIS — E7800 Pure hypercholesterolemia, unspecified: Secondary | ICD-10-CM | POA: Diagnosis not present

## 2024-03-07 DIAGNOSIS — E559 Vitamin D deficiency, unspecified: Secondary | ICD-10-CM | POA: Diagnosis not present

## 2024-03-07 DIAGNOSIS — Z1389 Encounter for screening for other disorder: Secondary | ICD-10-CM | POA: Diagnosis not present

## 2024-03-07 MED ORDER — LEVOCETIRIZINE DIHYDROCHLORIDE 5 MG PO TABS
5.0000 mg | ORAL_TABLET | Freq: Every evening | ORAL | 5 refills | Status: AC
  Filled 2024-03-07: qty 30, 30d supply, fill #0

## 2024-03-07 MED ORDER — FLUTICASONE PROPIONATE 50 MCG/ACT NA SUSP
2.0000 | Freq: Every day | NASAL | 5 refills | Status: AC
  Filled 2024-03-07: qty 16, 30d supply, fill #0

## 2024-03-07 MED ORDER — MONTELUKAST SODIUM 10 MG PO TABS
10.0000 mg | ORAL_TABLET | Freq: Every evening | ORAL | 5 refills | Status: AC
  Filled 2024-03-07: qty 90, 90d supply, fill #0

## 2024-03-27 ENCOUNTER — Ambulatory Visit
Admission: RE | Admit: 2024-03-27 | Discharge: 2024-03-27 | Disposition: A | Source: Ambulatory Visit | Attending: Obstetrics and Gynecology | Admitting: Obstetrics and Gynecology

## 2024-03-27 DIAGNOSIS — Z1231 Encounter for screening mammogram for malignant neoplasm of breast: Secondary | ICD-10-CM

## 2024-04-05 ENCOUNTER — Other Ambulatory Visit (HOSPITAL_COMMUNITY): Payer: Self-pay

## 2024-04-05 DIAGNOSIS — J3089 Other allergic rhinitis: Secondary | ICD-10-CM | POA: Diagnosis not present

## 2024-04-05 DIAGNOSIS — J301 Allergic rhinitis due to pollen: Secondary | ICD-10-CM | POA: Diagnosis not present

## 2024-04-05 DIAGNOSIS — H1045 Other chronic allergic conjunctivitis: Secondary | ICD-10-CM | POA: Diagnosis not present

## 2024-04-05 DIAGNOSIS — J3081 Allergic rhinitis due to animal (cat) (dog) hair and dander: Secondary | ICD-10-CM | POA: Diagnosis not present

## 2024-04-05 MED ORDER — FLUTICASONE PROPIONATE 50 MCG/ACT NA SUSP
1.0000 | Freq: Two times a day (BID) | NASAL | 5 refills | Status: AC
  Filled 2024-04-05: qty 16, 30d supply, fill #0

## 2024-04-05 MED ORDER — AZELASTINE HCL 0.05 % OP SOLN
1.0000 [drp] | Freq: Two times a day (BID) | OPHTHALMIC | 5 refills | Status: AC | PRN
  Filled 2024-04-05: qty 6, 34d supply, fill #0

## 2024-04-05 MED ORDER — DESLORATADINE 5 MG PO TABS
5.0000 mg | ORAL_TABLET | Freq: Every day | ORAL | 5 refills | Status: AC
  Filled 2024-04-05: qty 30, 30d supply, fill #0

## 2024-04-05 MED ORDER — CARBOXYMETHYLCELLULOSE SODIUM 0.5 % OP SOLN
1.0000 [drp] | Freq: Four times a day (QID) | OPHTHALMIC | 3 refills | Status: AC | PRN
  Filled 2024-04-05: qty 6, 15d supply, fill #0

## 2024-04-06 ENCOUNTER — Other Ambulatory Visit: Payer: Self-pay

## 2024-04-06 ENCOUNTER — Other Ambulatory Visit (HOSPITAL_COMMUNITY): Payer: Self-pay

## 2024-05-02 ENCOUNTER — Other Ambulatory Visit (HOSPITAL_COMMUNITY): Payer: Self-pay

## 2024-05-02 ENCOUNTER — Encounter (HOSPITAL_COMMUNITY): Payer: Self-pay

## 2024-05-02 DIAGNOSIS — D250 Submucous leiomyoma of uterus: Secondary | ICD-10-CM | POA: Diagnosis not present

## 2024-05-02 DIAGNOSIS — E611 Iron deficiency: Secondary | ICD-10-CM | POA: Diagnosis not present

## 2024-05-02 DIAGNOSIS — N920 Excessive and frequent menstruation with regular cycle: Secondary | ICD-10-CM | POA: Diagnosis not present

## 2024-05-02 MED ORDER — LUPRON DEPOT (3-MONTH) 11.25 MG IM KIT
11.2500 mg | PACK | Freq: Once | INTRAMUSCULAR | 0 refills | Status: AC
  Filled 2024-05-02 – 2024-06-05 (×2): qty 1, 30d supply, fill #0

## 2024-06-05 ENCOUNTER — Other Ambulatory Visit (HOSPITAL_COMMUNITY): Payer: Self-pay

## 2024-06-06 ENCOUNTER — Other Ambulatory Visit (HOSPITAL_COMMUNITY): Payer: Self-pay

## 2024-07-05 ENCOUNTER — Other Ambulatory Visit: Payer: Self-pay | Admitting: Obstetrics and Gynecology

## 2024-08-31 ENCOUNTER — Ambulatory Visit (HOSPITAL_COMMUNITY): Admit: 2024-08-31 | Admitting: Obstetrics and Gynecology

## 2024-08-31 SURGERY — HYSTEROSCOPY WITH MYOMECTOMY
Anesthesia: Monitor Anesthesia Care
